# Patient Record
Sex: Male | Born: 1980 | Race: White | Hispanic: No | Marital: Married | Smoking: Current some day smoker
Health system: Southern US, Community
[De-identification: ages and names within clinical notes are randomized; demographics above are authoritative.]

## PROBLEM LIST (undated history)

## (undated) DIAGNOSIS — F1010 Alcohol abuse, uncomplicated: Secondary | ICD-10-CM

## (undated) DIAGNOSIS — K859 Acute pancreatitis without necrosis or infection, unspecified: Secondary | ICD-10-CM

## (undated) HISTORY — PX: WISDOM TOOTH EXTRACTION: SHX21

## (undated) HISTORY — PX: OTHER SURGICAL HISTORY: SHX169

---

## 2014-07-31 ENCOUNTER — Emergency Department (HOSPITAL_BASED_OUTPATIENT_CLINIC_OR_DEPARTMENT_OTHER): Payer: BLUE CROSS/BLUE SHIELD

## 2014-07-31 ENCOUNTER — Encounter (HOSPITAL_BASED_OUTPATIENT_CLINIC_OR_DEPARTMENT_OTHER): Payer: Self-pay | Admitting: *Deleted

## 2014-07-31 ENCOUNTER — Inpatient Hospital Stay (HOSPITAL_BASED_OUTPATIENT_CLINIC_OR_DEPARTMENT_OTHER)
Admission: EM | Admit: 2014-07-31 | Discharge: 2014-08-06 | DRG: 440 | Disposition: A | Discharge status: Home / Self Care | Payer: BLUE CROSS/BLUE SHIELD | Attending: Surgery | Admitting: Surgery

## 2014-07-31 DIAGNOSIS — S3991XA Unspecified injury of abdomen, initial encounter: Secondary | ICD-10-CM | POA: Diagnosis present

## 2014-07-31 DIAGNOSIS — Z885 Allergy status to narcotic agent status: Secondary | ICD-10-CM

## 2014-07-31 DIAGNOSIS — K852 Alcohol induced acute pancreatitis: Principal | ICD-10-CM | POA: Diagnosis present

## 2014-07-31 DIAGNOSIS — K859 Acute pancreatitis without necrosis or infection, unspecified: Secondary | ICD-10-CM

## 2014-07-31 DIAGNOSIS — T1490XA Injury, unspecified, initial encounter: Secondary | ICD-10-CM

## 2014-07-31 DIAGNOSIS — T1490 Injury, unspecified: Secondary | ICD-10-CM | POA: Diagnosis not present

## 2014-07-31 DIAGNOSIS — W19XXXA Unspecified fall, initial encounter: Secondary | ICD-10-CM | POA: Diagnosis present

## 2014-07-31 DIAGNOSIS — W109XXA Fall (on) (from) unspecified stairs and steps, initial encounter: Secondary | ICD-10-CM | POA: Diagnosis present

## 2014-07-31 DIAGNOSIS — M549 Dorsalgia, unspecified: Secondary | ICD-10-CM | POA: Diagnosis present

## 2014-07-31 DIAGNOSIS — F1010 Alcohol abuse, uncomplicated: Secondary | ICD-10-CM | POA: Diagnosis present

## 2014-07-31 DIAGNOSIS — S39012A Strain of muscle, fascia and tendon of lower back, initial encounter: Secondary | ICD-10-CM

## 2014-07-31 DIAGNOSIS — F17210 Nicotine dependence, cigarettes, uncomplicated: Secondary | ICD-10-CM | POA: Diagnosis present

## 2014-07-31 DIAGNOSIS — R102 Pelvic and perineal pain: Secondary | ICD-10-CM | POA: Diagnosis present

## 2014-07-31 LAB — COMPREHENSIVE METABOLIC PANEL
ALBUMIN: 4.4 g/dL (ref 3.5–5.2)
ALT: 19 U/L (ref 0–53)
ANION GAP: 6 (ref 5–15)
AST: 35 U/L (ref 0–37)
Alkaline Phosphatase: 119 U/L — ABNORMAL HIGH (ref 39–117)
BUN: 10 mg/dL (ref 6–23)
CALCIUM: 8.6 mg/dL (ref 8.4–10.5)
CO2: 25 mmol/L (ref 19–32)
CREATININE: 0.81 mg/dL (ref 0.50–1.35)
Chloride: 104 mmol/L (ref 96–112)
GFR calc Af Amer: >90 mL/min (ref >90)
Glucose, Bld: 112 mg/dL — ABNORMAL HIGH (ref 70–99)
Potassium: 3.6 mmol/L (ref 3.5–5.1)
Sodium: 135 mmol/L (ref 135–145)
Total Bilirubin: 1.4 mg/dL — ABNORMAL HIGH (ref 0.3–1.2)
Total Protein: 7.3 g/dL (ref 6.0–8.3)

## 2014-07-31 LAB — CBC
HCT: 41.9 % (ref 39.0–52.0)
HEMOGLOBIN: 14.8 g/dL (ref 13.0–17.0)
MCH: 31 pg (ref 26.0–34.0)
MCHC: 35.3 g/dL (ref 30.0–36.0)
MCV: 87.8 fL (ref 78.0–100.0)
Platelets: 254 10*3/uL (ref 150–400)
RBC: 4.77 MIL/uL (ref 4.22–5.81)
RDW: 12.7 % (ref 11.5–15.5)
WBC: 12 10*3/uL — AB (ref 4.0–10.5)

## 2014-07-31 LAB — URINALYSIS, ROUTINE W REFLEX MICROSCOPIC
Bilirubin Urine: NEGATIVE
Glucose, UA: NEGATIVE mg/dL
Hgb urine dipstick: NEGATIVE
Ketones, ur: NEGATIVE mg/dL
LEUKOCYTES UA: NEGATIVE
Nitrite: NEGATIVE
Protein, ur: NEGATIVE mg/dL
UROBILINOGEN UA: 0.2 mg/dL (ref 0.0–1.0)
pH: 6 (ref 5.0–8.0)

## 2014-07-31 LAB — PROTIME-INR
INR: 1 (ref 0.00–1.49)
Prothrombin Time: 13.2 seconds (ref 11.6–15.2)

## 2014-07-31 LAB — LIPASE, BLOOD: Lipase: 119 U/L — ABNORMAL HIGH (ref 11–59)

## 2014-07-31 LAB — ETHANOL: Alcohol, Ethyl (B): <5 mg/dL (ref 0–9)

## 2014-07-31 MED ORDER — MORPHINE SULFATE 4 MG/ML IJ SOLN
4.0000 mg | INTRAMUSCULAR | Status: DC | PRN
  Administered 2014-07-31: 4 mg via INTRAVENOUS
  Filled 2014-07-31: qty 1

## 2014-07-31 MED ORDER — KETOROLAC TROMETHAMINE 30 MG/ML IJ SOLN
INTRAMUSCULAR | Status: AC
  Administered 2014-07-31: 30 mg
  Filled 2014-07-31: qty 1

## 2014-07-31 MED ORDER — HYDROMORPHONE HCL 1 MG/ML IJ SOLN: 0.5000 mg | INTRAMUSCULAR | Status: DC | PRN

## 2014-07-31 MED ORDER — ONDANSETRON HCL 4 MG/2ML IJ SOLN
4.0000 mg | Freq: Four times a day (QID) | INTRAMUSCULAR | Status: DC | PRN
  Administered 2014-08-01: 4 mg via INTRAVENOUS
  Filled 2014-07-31: qty 2

## 2014-07-31 MED ORDER — ONDANSETRON HCL 4 MG/2ML IJ SOLN
4.0000 mg | Freq: Once | INTRAMUSCULAR | Status: AC
  Administered 2014-07-31: 4 mg via INTRAVENOUS
  Filled 2014-07-31: qty 2

## 2014-07-31 MED ORDER — ENOXAPARIN SODIUM 40 MG/0.4ML ~~LOC~~ SOLN
40.0000 mg | SUBCUTANEOUS | Status: DC
  Administered 2014-08-01 – 2014-08-06 (×6): 40 mg via SUBCUTANEOUS
  Filled 2014-07-31 (×8): qty 0.4

## 2014-07-31 MED ORDER — MORPHINE SULFATE 4 MG/ML IJ SOLN
4.0000 mg | Freq: Once | INTRAMUSCULAR | Status: AC
  Administered 2014-07-31: 4 mg via INTRAVENOUS
  Filled 2014-07-31: qty 1

## 2014-07-31 MED ORDER — KETOROLAC TROMETHAMINE 30 MG/ML IJ SOLN
30.0000 mg | Freq: Once | INTRAMUSCULAR | Status: AC
  Administered 2014-07-31: 30 mg via INTRAVENOUS

## 2014-07-31 MED ORDER — PANTOPRAZOLE SODIUM 40 MG PO TBEC
40.0000 mg | DELAYED_RELEASE_TABLET | Freq: Every day | ORAL | Status: DC
  Administered 2014-07-31 – 2014-08-06 (×7): 40 mg via ORAL
  Filled 2014-07-31 (×8): qty 1

## 2014-07-31 MED ORDER — CHLORHEXIDINE GLUCONATE 0.12 % MT SOLN
15.0000 mL | Freq: Two times a day (BID) | OROMUCOSAL | Status: DC
  Administered 2014-08-01 – 2014-08-03 (×3): 15 mL via OROMUCOSAL
  Filled 2014-07-31 (×3): qty 15

## 2014-07-31 MED ORDER — ONDANSETRON HCL 4 MG PO TABS: 4.0000 mg | ORAL_TABLET | Freq: Four times a day (QID) | ORAL | Status: DC | PRN

## 2014-07-31 MED ORDER — PANTOPRAZOLE SODIUM 40 MG IV SOLR
40.0000 mg | Freq: Every day | INTRAVENOUS | Status: DC
  Filled 2014-07-31: qty 40

## 2014-07-31 MED ORDER — POTASSIUM CHLORIDE IN NACL 20-0.9 MEQ/L-% IV SOLN
INTRAVENOUS | Status: DC
  Administered 2014-07-31 – 2014-08-05 (×13): via INTRAVENOUS
  Filled 2014-07-31 (×16): qty 1000

## 2014-07-31 MED ORDER — CETYLPYRIDINIUM CHLORIDE 0.05 % MT LIQD
7.0000 mL | Freq: Two times a day (BID) | OROMUCOSAL | Status: DC
  Administered 2014-07-31 – 2014-08-03 (×2): 7 mL via OROMUCOSAL

## 2014-07-31 MED ORDER — MORPHINE SULFATE 4 MG/ML IJ SOLN
4.0000 mg | Freq: Once | INTRAMUSCULAR | Status: AC
Start: 2014-07-31 — End: 2014-07-31
  Administered 2014-07-31: 4 mg via INTRAVENOUS
  Filled 2014-07-31: qty 1

## 2014-07-31 MED ORDER — IOHEXOL 300 MG/ML  SOLN
100.0000 mL | Freq: Once | INTRAMUSCULAR | Status: AC | PRN
  Administered 2014-07-31: 100 mL via INTRAVENOUS

## 2014-07-31 MED ORDER — SODIUM CHLORIDE 0.9 % IV BOLUS (SEPSIS)
1000.0000 mL | Freq: Once | INTRAVENOUS | Status: AC
  Administered 2014-07-31: 1000 mL via INTRAVENOUS

## 2014-07-31 MED ORDER — HYDROMORPHONE HCL 1 MG/ML IJ SOLN
1.0000 mg | Freq: Once | INTRAMUSCULAR | Status: AC
Start: 2014-07-31 — End: 2014-08-01
  Administered 2014-07-31: 1 mg via INTRAVENOUS
  Filled 2014-07-31: qty 1

## 2014-07-31 MED ORDER — HYDROMORPHONE HCL 1 MG/ML IJ SOLN
1.0000 mg | INTRAMUSCULAR | Status: DC | PRN
  Administered 2014-08-01: 1 mg via INTRAVENOUS
  Filled 2014-07-31 (×3): qty 1

## 2014-07-31 MED ORDER — HYDROMORPHONE HCL 1 MG/ML IJ SOLN
1.0000 mg | INTRAMUSCULAR | Status: DC | PRN
  Administered 2014-07-31 – 2014-08-01 (×13): 1 mg via INTRAVENOUS
  Filled 2014-07-31 (×11): qty 1

## 2014-07-31 NOTE — ED Notes (Addendum)
Patient c/o lower back and abd pain after slipping and falling down a flight of stairs yesterday. Woke up around 3am with abd pain andVomited this morning, abd remains tender no LOC

## 2014-07-31 NOTE — ED Provider Notes (Signed)
CSN: [id number]     Arrival date & time 07/31/14  5284 History   First MD Initiated Contact with Patient 07/31/14 1005     Chief Complaint  Patient presents with  . Fall      Patient is a 34 y.o. male presenting with fall. The history is provided by the patient.  Fall This is a new problem. The current episode started 12 to 24 hours ago. The problem has been rapidly worsening. Associated symptoms include abdominal pain. Pertinent negatives include no chest pain, no headaches and no shortness of breath. The symptoms are aggravated by walking. The symptoms are relieved by rest.  Patient reports he slipped/fell down approximately 20 steps yesterday He reports there was water at top of steps which caused him to slip He reports falling on back.  He did not fall head over heels No head injury.  No LOC No HA No neck pain He reports he felt sore after but no immediate pain He woke up this morning at 3:30am, felt abdominal pain, drank coca cola and immediately had nonbloody emesis Since then he has had persistent abdominal and back pain He denies arm/leg weakness/numbness No cp/sob  PMH - none Soc hx - drinks ETOH most days of the week "1 or 2 beers" Past Surgical History  Procedure Laterality Date  .  thumb surgery Right   . Wisdom tooth extraction Bilateral    No family history on file. History  Substance Use Topics  . Smoking status: Current Some Day Smoker  . Smokeless tobacco: Not on file  . Alcohol Use: Yes    Review of Systems  Constitutional: Negative for fever.  Respiratory: Negative for shortness of breath.   Cardiovascular: Negative for chest pain.  Gastrointestinal: Positive for vomiting, abdominal pain and constipation.  Genitourinary: Negative for difficulty urinating.  Musculoskeletal: Positive for back pain. Negative for neck pain.  Neurological: Negative for weakness and headaches.  All other systems reviewed and are negative.     Allergies   Codeine  Home Medications   Prior to Admission medications   Not on File   BP 147/83 mmHg  Pulse 74  Temp(Src) 97.9 F (36.6 C) (Oral)  Resp 20  Ht 5' 8"  (1.727 m)  Wt 140 lb (63.504 kg)  BMI 21.29 kg/m2  SpO2 100% Physical Exam CONSTITUTIONAL: Well developed/well nourished HEAD: Normocephalic/atraumatic EYES: EOMI/PERRL ENMT: Mucous membranes moist, No evidence of facial/nasal trauma NECK: supple no meningeal signs SPINE/BACK:no cervical spine tenderness.  NEXUS criteria met.  He has abrasion/tenderness to lumbar spine and mild tenderness to low thoracic spine.  No bruising/crepitance/stepoffs noted to spine CV: S1/S2 noted, no murmurs/rubs/gallops noted LUNGS: Lungs are clear to auscultation bilaterally, no apparent distress Chest -  Mild chest wall tenderness to low chest but no bruising noted ABDOMEN: soft, diffuse moderate tenderness with severe tenderness to LUQ.   GU:no cva tenderness NEURO: Pt is awake/alert/appropriate, moves all extremitiesx4.  No facial droop.  GCS 15 EXTREMITIES: pulses normal/equal, full ROM, All other extremities/joints palpated/ranged and nontender SKIN: warm, color normal PSYCH: no abnormalities of mood noted, alert and oriented to situation  ED Course  Procedures  11:09 AM Pt with significant low back/abdominal tenderness, imaging ordered He has no signs of head injury and no signs of neck injury Will follow closely Currently his vitals are appropriate 12:14 PM Discussed CT finding with patient Will consult trauma D/w dr Marden Noble blackman he will review CT imaging 12:28 PM D/w dr Ninfa Linden He accepts in transfer to  [facility] after reviewing CT imaging D/w ER physician dr Doy Mince aware of patient  Labs Review Labs Reviewed  COMPREHENSIVE METABOLIC PANEL - Abnormal; Notable for the following:    Glucose, Bld 112 (*)    Alkaline Phosphatase 119 (*)    Total Bilirubin 1.4 (*)    All other components within normal limits  CBC - Abnormal;  Notable for the following:    WBC 12.0 (*)    All other components within normal limits  URINALYSIS, ROUTINE W REFLEX MICROSCOPIC - Abnormal; Notable for the following:    Specific Gravity, Urine >1.046 (*)    All other components within normal limits  LIPASE, BLOOD - Abnormal; Notable for the following:    Lipase 119 (*)    All other components within normal limits  ETHANOL  PROTIME-INR    Imaging Review Dg Chest 1 View  07/31/2014   CLINICAL DATA:  Fall down stairs yesterday.  Multiple trauma.  EXAM: CHEST  1 VIEW  COMPARISON:  01/10/2012  FINDINGS: The heart size and mediastinal contours are within normal limits. Both lungs are clear. No evidence of pneumothorax or hemothorax. The visualized skeletal structures are unremarkable.  IMPRESSION: No active disease.   Electronically Signed   By: Earle Gell M.D.   On: 07/31/2014 11:13   Ct Abdomen Pelvis W Contrast  07/31/2014   CLINICAL DATA:  34 year old male with acute fall and left abdominal and pelvic pain. Initial encounter.  EXAM: CT ABDOMEN AND PELVIS WITH CONTRAST  TECHNIQUE: Multidetector CT imaging of the abdomen and pelvis was performed using the standard protocol following bolus administration of intravenous contrast.  CONTRAST:  146m OMNIPAQUE IOHEXOL 300 MG/ML  SOLN  COMPARISON:  None.  FINDINGS: The lung bases are unremarkable.  A tiny cyst in the inferior right liver is identified. The liver is otherwise unremarkable.  The spleen, gallbladder, kidneys, adrenal glands and bladder are unremarkable.  A small amount of fluid/inflammation adjacent to the pancreatic tail and a mildly distended loop of small bowel (images 30-36) is nonspecific and could represent occult small bowel or venous injury. Occult pancreatic injury and mild pancreatitis is considered somewhat less likely as there appears be a fat plane between the pancreas and the fluid/inflammation. The pancreas appears unremarkable. There is no evidence of small bowel wall  thickening, pneumoperitoneum or bowel obstruction.  No enlarged lymph nodes, biliary dilatation or abdominal aortic aneurysm identified.  No acute or suspicious bony abnormalities are noted.  IMPRESSION: Small amount of fluid/ inflammation adjacent to the pancreatic tail and a mildly distended small bowel loop. This could represent occult small bowel injury or small venous injury. Occult pancreatic injury and mild pancreatitis is considered slightly less likely as there appears to be a fat plane between the pancreas and fluid/inflammation. No evidence of pneumoperitoneum or bowel wall thickening.  No other significant abnormalities identified.   Electronically Signed   By: JMargarette CanadaM.D.   On: 07/31/2014 11:18   Dg Thoracic Spine 1vclearing  07/31/2014   CLINICAL DATA:  Fall down stairs. Thoracic back pain. Multiple trauma.  EXAM: CLEARING THORACIC SPINE-ONE VIEW  COMPARISON:  None.  FINDINGS: Clearing cross-table lateral radiograph shows no definite evidence of thoracic spine fracture or subluxation. Note that this is not a complete radiographic evaluation.  IMPRESSION: Limited clearing view of the thoracic spine shows no definite acute abnormality.   Electronically Signed   By: JEarle GellM.D.   On: 07/31/2014 11:18    Medications  sodium chloride 0.9 %  bolus 1,000 mL (not administered)  HYDROmorphone (DILAUDID) injection 1 mg (not administered)  morphine 4 MG/ML injection 4 mg (4 mg Intravenous Given 07/31/14 1035)  ondansetron (ZOFRAN) injection 4 mg (4 mg Intravenous Given 07/31/14 1035)  iohexol (OMNIPAQUE) 300 MG/ML solution 100 mL (100 mLs Intravenous Contrast Given 07/31/14 1056)  morphine 4 MG/ML injection 4 mg (4 mg Intravenous Given 07/31/14 1136)    MDM   Final diagnoses:  Blunt abdominal trauma, initial encounter  Acute pancreatitis, unspecified pancreatitis type  Lumbar strain, initial encounter    Nursing notes including past medical history and social history reviewed and  considered in documentation Labs/vital reviewed myself and considered during evaluation xrays/imaging reviewed by myself and considered during evaluation     Sharyon Cable, MD 07/31/14 1228

## 2014-07-31 NOTE — ED Notes (Signed)
EMS called [facility]

## 2014-07-31 NOTE — ED Notes (Signed)
Patient came from [facility] on arrival. Patient fell yesterday and abnormal CT Pt transfer [facility].

## 2014-07-31 NOTE — H&P (Signed)
History   Darren Parrish is an 34 y.o. male.   Chief Complaint:  Chief Complaint  Patient presents with  . Fall    Fall   this gentleman presents as a transfer from [facility]. He presented there after a fall that occurred yesterday. He reports he slipped at the top of the stairs and fell down approximately 25 stairs on his back. He hit only his back. He denies headache, loss of consciousness, or neck pain. He did not hit his abdomen. He awoke this morning with moderate to severe left-sided abdominal pain. He had one episode of emesis.  He no longer has nausea. He still has moderate left-sided mostly upper abdominal pain which is described as both sharp and cramping. He does feel like it has improved somewhat. He admits to drinking approximately 4-5 beers yesterday. He has no other complaints.  History reviewed. No pertinent past medical history.  Past Surgical History  Procedure Laterality Date  .  thumb surgery Right   . Wisdom tooth extraction Bilateral     No family history on file. Social History:  reports that he has been smoking.  He does not have any smokeless tobacco history on file. He reports that he drinks alcohol. His drug history is not on file.  Allergies   Allergies  Allergen Reactions  . Codeine Nausea Only    Home Medications   (Not in a hospital admission)  Trauma Course   Results for orders placed or performed during the hospital encounter of 07/31/14 (from the past 48 hour(s))  Comprehensive metabolic panel     Status: Abnormal   Collection Time: 07/31/14 10:30 AM  Result Value Ref Range   Sodium 135 135 - 145 mmol/L   Potassium 3.6 3.5 - 5.1 mmol/L   Chloride 104 96 - 112 mmol/L   CO2 25 19 - 32 mmol/L   Glucose, Bld 112 (H) 70 - 99 mg/dL   BUN 10 6 - 23 mg/dL   Creatinine, Ser 0.81 0.50 - 1.35 mg/dL   Calcium 8.6 8.4 - 10.5 mg/dL   Total Protein 7.3 6.0 - 8.3 g/dL   Albumin 4.4 3.5 - 5.2 g/dL   AST 35 0 - 37 U/L   ALT 19 0 - 53 U/L    Alkaline Phosphatase 119 (H) 39 - 117 U/L   Total Bilirubin 1.4 (H) 0.3 - 1.2 mg/dL   GFR calc non Af Amer >90 >90 mL/min   GFR calc Af Amer >90 >90 mL/min    Comment: (NOTE) The eGFR has been calculated using the CKD EPI equation. This calculation has not been validated in all clinical situations. eGFR's persistently <90 mL/min signify possible Chronic Kidney Disease.    Anion gap 6 5 - 15  CBC     Status: Abnormal   Collection Time: 07/31/14 10:30 AM  Result Value Ref Range   WBC 12.0 (H) 4.0 - 10.5 K/uL   RBC 4.77 4.22 - 5.81 MIL/uL   Hemoglobin 14.8 13.0 - 17.0 g/dL   HCT 41.9 39.0 - 52.0 %   MCV 87.8 78.0 - 100.0 fL   MCH 31.0 26.0 - 34.0 pg   MCHC 35.3 30.0 - 36.0 g/dL   RDW 12.7 11.5 - 15.5 %   Platelets 254 150 - 400 K/uL  Ethanol     Status: None   Collection Time: 07/31/14 10:30 AM  Result Value Ref Range   Alcohol, Ethyl (B) <5 0 - 9 mg/dL    Comment:  LOWEST DETECTABLE LIMIT FOR SERUM ALCOHOL IS 11 mg/dL FOR MEDICAL PURPOSES ONLY   Protime-INR     Status: None   Collection Time: 07/31/14 10:30 AM  Result Value Ref Range   Prothrombin Time 13.2 11.6 - 15.2 seconds   INR 1.00 0.00 - 1.49  Lipase, blood     Status: Abnormal   Collection Time: 07/31/14 10:30 AM  Result Value Ref Range   Lipase 119 (H) 11 - 59 U/L  Urinalysis, Routine w reflex microscopic     Status: Abnormal   Collection Time: 07/31/14 11:31 AM  Result Value Ref Range   Color, Urine YELLOW YELLOW   APPearance CLEAR CLEAR   Specific Gravity, Urine >1.046 (H) 1.005 - 1.030   pH 6.0 5.0 - 8.0   Glucose, UA NEGATIVE NEGATIVE mg/dL   Hgb urine dipstick NEGATIVE NEGATIVE   Bilirubin Urine NEGATIVE NEGATIVE   Ketones, ur NEGATIVE NEGATIVE mg/dL   Protein, ur NEGATIVE NEGATIVE mg/dL   Urobilinogen, UA 0.2 0.0 - 1.0 mg/dL   Nitrite NEGATIVE NEGATIVE   Leukocytes, UA NEGATIVE NEGATIVE    Comment: MICROSCOPIC NOT DONE ON URINES WITH NEGATIVE PROTEIN, BLOOD, LEUKOCYTES, NITRITE, OR GLUCOSE  <1000 mg/dL.   Dg Chest 1 View  07/31/2014   CLINICAL DATA:  Fall down stairs yesterday.  Multiple trauma.  EXAM: CHEST  1 VIEW  COMPARISON:  01/10/2012  FINDINGS: The heart size and mediastinal contours are within normal limits. Both lungs are clear. No evidence of pneumothorax or hemothorax. The visualized skeletal structures are unremarkable.  IMPRESSION: No active disease.   Electronically Signed   By: Earle Gell M.D.   On: 07/31/2014 11:13   Ct Abdomen Pelvis W Contrast  07/31/2014   CLINICAL DATA:  34 year old male with acute fall and left abdominal and pelvic pain. Initial encounter.  EXAM: CT ABDOMEN AND PELVIS WITH CONTRAST  TECHNIQUE: Multidetector CT imaging of the abdomen and pelvis was performed using the standard protocol following bolus administration of intravenous contrast.  CONTRAST:  128m OMNIPAQUE IOHEXOL 300 MG/ML  SOLN  COMPARISON:  None.  FINDINGS: The lung bases are unremarkable.  A tiny cyst in the inferior right liver is identified. The liver is otherwise unremarkable.  The spleen, gallbladder, kidneys, adrenal glands and bladder are unremarkable.  A small amount of fluid/inflammation adjacent to the pancreatic tail and a mildly distended loop of small bowel (images 30-36) is nonspecific and could represent occult small bowel or venous injury. Occult pancreatic injury and mild pancreatitis is considered somewhat less likely as there appears be a fat plane between the pancreas and the fluid/inflammation. The pancreas appears unremarkable. There is no evidence of small bowel wall thickening, pneumoperitoneum or bowel obstruction.  No enlarged lymph nodes, biliary dilatation or abdominal aortic aneurysm identified.  No acute or suspicious bony abnormalities are noted.  IMPRESSION: Small amount of fluid/ inflammation adjacent to the pancreatic tail and a mildly distended small bowel loop. This could represent occult small bowel injury or small venous injury. Occult pancreatic injury and  mild pancreatitis is considered slightly less likely as there appears to be a fat plane between the pancreas and fluid/inflammation. No evidence of pneumoperitoneum or bowel wall thickening.  No other significant abnormalities identified.   Electronically Signed   By: JMargarette CanadaM.D.   On: 07/31/2014 11:18   Dg Thoracic Spine 1vclearing  07/31/2014   CLINICAL DATA:  Fall down stairs. Thoracic back pain. Multiple trauma.  EXAM: CLEARING THORACIC SPINE-ONE VIEW  COMPARISON:  None.  FINDINGS: Clearing cross-table lateral radiograph shows no definite evidence of thoracic spine fracture or subluxation. Note that this is not a complete radiographic evaluation.  IMPRESSION: Limited clearing view of the thoracic spine shows no definite acute abnormality.   Electronically Signed   By: Earle Gell M.D.   On: 07/31/2014 11:18    Review of Systems  All other systems reviewed and are negative.   Blood pressure 128/75, pulse 67, temperature 97.9 F (36.6 C), temperature source Oral, resp. rate 16, height 5' 8"  (1.727 m), weight 140 lb (63.504 kg), SpO2 100 %. Physical Exam  Constitutional: He is oriented to person, place, and time. He appears well-developed and well-nourished. No distress.  HENT:  Head: Normocephalic and atraumatic.  Right Ear: External ear normal.  Left Ear: External ear normal.  Nose: Nose normal.  Mouth/Throat: Oropharynx is clear and moist. No oropharyngeal exudate.  Eyes: Conjunctivae are normal. Pupils are equal, round, and reactive to light. No scleral icterus.  Neck: Normal range of motion. No tracheal deviation present.  Cervical spine is nontender  Cardiovascular: Normal rate, regular rhythm, normal heart sounds and intact distal pulses.   No murmur heard. Respiratory: Effort normal and breath sounds normal. No respiratory distress. He exhibits no tenderness.  GI: Soft. There is tenderness. There is guarding.  There is mild to moderate tenderness in the epigastrium and left  upper quadrant. There is no frank peritonitis  Musculoskeletal: Normal range of motion. He exhibits no edema or tenderness.  Neurological: He is alert and oriented to person, place, and time.  Skin: Skin is warm and dry. No rash noted. No erythema.  Psychiatric: His behavior is normal. Judgment normal.   there is an abrasion with tenderness at the midline of his lower back  Assessment/Plan Patient status post fall with blunt trauma to the abdomen.  The CT findings are suspicious that he may have had an injury to the proximal small bowel or the mesentery near the tail the pancreas. His lipase is elevated and I cannot totally rule out that this is pancreatitis. The CAT scan did not show any free air. His exam is currently not consistent with perforated bowel. I believe he needs placement in observation with bowel rest and serial abdominal exams. If his condition worsens, he may either need a repeat CAT scan versus a laparotomy for a possible bowel injury. I explained this to the patient and his wife. He is in agreement with the admission.  Sharonda Llamas A 07/31/2014, 2:18 PM   Procedures

## 2014-07-31 NOTE — ED Notes (Signed)
Attemped report 

## 2014-08-01 ENCOUNTER — Observation Stay (HOSPITAL_COMMUNITY): Payer: BLUE CROSS/BLUE SHIELD

## 2014-08-01 DIAGNOSIS — K852 Alcohol induced acute pancreatitis: Secondary | ICD-10-CM | POA: Diagnosis present

## 2014-08-01 DIAGNOSIS — F1010 Alcohol abuse, uncomplicated: Secondary | ICD-10-CM | POA: Diagnosis present

## 2014-08-01 DIAGNOSIS — M549 Dorsalgia, unspecified: Secondary | ICD-10-CM | POA: Diagnosis present

## 2014-08-01 DIAGNOSIS — R102 Pelvic and perineal pain: Secondary | ICD-10-CM | POA: Diagnosis present

## 2014-08-01 DIAGNOSIS — S3991XA Unspecified injury of abdomen, initial encounter: Secondary | ICD-10-CM | POA: Diagnosis present

## 2014-08-01 DIAGNOSIS — W109XXA Fall (on) (from) unspecified stairs and steps, initial encounter: Secondary | ICD-10-CM | POA: Diagnosis present

## 2014-08-01 DIAGNOSIS — T1490 Injury, unspecified: Secondary | ICD-10-CM | POA: Diagnosis present

## 2014-08-01 DIAGNOSIS — K859 Acute pancreatitis without necrosis or infection, unspecified: Secondary | ICD-10-CM | POA: Diagnosis present

## 2014-08-01 DIAGNOSIS — Z885 Allergy status to narcotic agent status: Secondary | ICD-10-CM | POA: Diagnosis not present

## 2014-08-01 DIAGNOSIS — F17210 Nicotine dependence, cigarettes, uncomplicated: Secondary | ICD-10-CM | POA: Diagnosis present

## 2014-08-01 LAB — COMPREHENSIVE METABOLIC PANEL
ALT: 21 U/L (ref 0–53)
ANION GAP: 11 (ref 5–15)
AST: 47 U/L — AB (ref 0–37)
Albumin: 3.8 g/dL (ref 3.5–5.2)
Alkaline Phosphatase: 120 U/L — ABNORMAL HIGH (ref 39–117)
BUN: 7 mg/dL (ref 6–23)
CO2: 26 mmol/L (ref 19–32)
Calcium: 8.6 mg/dL (ref 8.4–10.5)
Chloride: 104 mmol/L (ref 96–112)
Creatinine, Ser: 0.87 mg/dL (ref 0.50–1.35)
GFR calc Af Amer: >90 mL/min (ref >90)
Glucose, Bld: 88 mg/dL (ref 70–99)
Potassium: 3.6 mmol/L (ref 3.5–5.1)
SODIUM: 141 mmol/L (ref 135–145)
Total Bilirubin: 3.7 mg/dL — ABNORMAL HIGH (ref 0.3–1.2)
Total Protein: 6.8 g/dL (ref 6.0–8.3)

## 2014-08-01 LAB — CBC
HEMATOCRIT: 40.7 % (ref 39.0–52.0)
Hemoglobin: 14.2 g/dL (ref 13.0–17.0)
MCH: 31 pg (ref 26.0–34.0)
MCHC: 34.9 g/dL (ref 30.0–36.0)
MCV: 88.9 fL (ref 78.0–100.0)
PLATELETS: 194 10*3/uL (ref 150–400)
RBC: 4.58 MIL/uL (ref 4.22–5.81)
RDW: 13 % (ref 11.5–15.5)
WBC: 7.5 10*3/uL (ref 4.0–10.5)

## 2014-08-01 LAB — LIPASE, BLOOD: Lipase: 934 U/L — ABNORMAL HIGH (ref 11–59)

## 2014-08-01 LAB — BILIRUBIN, FRACTIONATED(TOT/DIR/INDIR)
BILIRUBIN DIRECT: 0.4 mg/dL (ref 0.0–0.5)
Indirect Bilirubin: 3.3 mg/dL — ABNORMAL HIGH (ref 0.3–0.9)
Total Bilirubin: 3.7 mg/dL — ABNORMAL HIGH (ref 0.3–1.2)

## 2014-08-01 LAB — AMYLASE: Amylase: 539 U/L — ABNORMAL HIGH (ref 0–105)

## 2014-08-01 MED ORDER — HYDROMORPHONE HCL 1 MG/ML IJ SOLN
INTRAMUSCULAR | Status: AC
  Filled 2014-08-01: qty 1

## 2014-08-01 MED ORDER — DIPHENHYDRAMINE HCL 12.5 MG/5ML PO ELIX: 12.5000 mg | ORAL_SOLUTION | Freq: Four times a day (QID) | ORAL | Status: DC | PRN

## 2014-08-01 MED ORDER — DIPHENHYDRAMINE HCL 50 MG/ML IJ SOLN: 12.5000 mg | Freq: Four times a day (QID) | INTRAMUSCULAR | Status: DC | PRN

## 2014-08-01 MED ORDER — HYDROMORPHONE 0.3 MG/ML IV SOLN
INTRAVENOUS | Status: DC
  Administered 2014-08-01 (×2): via INTRAVENOUS
  Administered 2014-08-01: 12.47 mg via INTRAVENOUS
  Administered 2014-08-02: 6.01 mg via INTRAVENOUS
  Administered 2014-08-02: 8.68 mg via INTRAVENOUS
  Administered 2014-08-02: 11:00:00 via INTRAVENOUS
  Filled 2014-08-01 (×5): qty 25

## 2014-08-01 MED ORDER — SODIUM CHLORIDE 0.9 % IJ SOLN: 9.0000 mL | INTRAMUSCULAR | Status: DC | PRN

## 2014-08-01 MED ORDER — HYDROMORPHONE 0.3 MG/ML IV SOLN
INTRAVENOUS | Status: DC
  Administered 2014-08-01: 12:00:00 via INTRAVENOUS
  Filled 2014-08-01: qty 25

## 2014-08-01 MED ORDER — HYDROMORPHONE HCL 1 MG/ML IJ SOLN
1.0000 mg | Freq: Once | INTRAMUSCULAR | Status: AC
  Administered 2014-08-01: 1 mg via INTRAVENOUS

## 2014-08-01 MED ORDER — NALOXONE HCL 0.4 MG/ML IJ SOLN: 0.4000 mg | INTRAMUSCULAR | Status: DC | PRN

## 2014-08-01 NOTE — Progress Notes (Signed)
Patient ID: Darren Parrish, male   DOB: 02/12/1981, 34 y.o.   MRN: [id number]   Subjective: Doesn't feel any better. Significant LUQ/epigastric pain with radiation to left back. Denies N/V.   Objective: Vital signs in last 24 hours: Temp:  [97.9 F (36.6 C)-98.4 F (36.9 C)] 97.9 F (36.6 C) (02/15 0525) Pulse Rate:  [64-74] 66 (02/15 0525) Resp:  [16-20] 18 (02/15 0525) BP: (124-147)/(75-84) 142/78 mmHg (02/15 0525) SpO2:  [97 %-100 %] 98 % (02/15 0525) Weight:  [140 lb (63.504 kg)-145 lb 8 oz (65.998 kg)] 145 lb 8 oz (65.998 kg) (02/14 1611) Last BM Date: 07/28/14   Laboratory  CBC  Recent Labs  07/31/14 1030 08/01/14 0441  WBC 12.0* 7.5  HGB 14.8 14.2  HCT 41.9 40.7  PLT 254 194   BMET  Recent Labs  07/31/14 1030 08/01/14 0441  NA 135 141  K 3.6 3.6  CL 104 104  CO2 25 26  GLUCOSE 112* 88  BUN 10 7  CREATININE 0.81 0.87  CALCIUM 8.6 8.6   CMP     Component Value Date/Time   NA 141 08/01/2014 0441   K 3.6 08/01/2014 0441   CL 104 08/01/2014 0441   CO2 26 08/01/2014 0441   GLUCOSE 88 08/01/2014 0441   BUN 7 08/01/2014 0441   CREATININE 0.87 08/01/2014 0441   CALCIUM 8.6 08/01/2014 0441   PROT 6.8 08/01/2014 0441   ALBUMIN 3.8 08/01/2014 0441   AST 47* 08/01/2014 0441   ALT 21 08/01/2014 0441   ALKPHOS 120* 08/01/2014 0441   BILITOT 3.7* 08/01/2014 0441   GFRNONAA >90 08/01/2014 0441   GFRAA >90 08/01/2014 0441   Amylase: 539 Lipase: 934   Physical Exam General appearance: alert and no distress Resp: clear to auscultation bilaterally Cardio: regular rate and rhythm GI: Soft, +BS, - Murphy's, +LUQ TTP   Assessment/Plan: Fall Pancreatitis -- Unclear if this is traumatic, alcoholic, or gallstone. Elevated bili is suggestive of latter. Will fractionate and get RUQ US. EtOH use -- SW to assess FEN -- Continue NPO, IVF. Will give PCA. VTE -- SCD's, Lovenox Dispo -- As above    Freeman CaldronMichael J. Deunte Bledsoe, PA-C Pager: [phone] General Trauma PA  Pager: [phone]  08/01/2014

## 2014-08-01 NOTE — Progress Notes (Signed)
UR completed 

## 2014-08-02 DIAGNOSIS — F1010 Alcohol abuse, uncomplicated: Secondary | ICD-10-CM | POA: Diagnosis present

## 2014-08-02 LAB — COMPREHENSIVE METABOLIC PANEL
ALK PHOS: 101 U/L (ref 39–117)
ALT: 15 U/L (ref 0–53)
AST: 30 U/L (ref 0–37)
Albumin: 3.3 g/dL — ABNORMAL LOW (ref 3.5–5.2)
Anion gap: 12 (ref 5–15)
BILIRUBIN TOTAL: 2.7 mg/dL — AB (ref 0.3–1.2)
BUN: 8 mg/dL (ref 6–23)
CHLORIDE: 103 mmol/L (ref 96–112)
CO2: 21 mmol/L (ref 19–32)
CREATININE: 0.96 mg/dL (ref 0.50–1.35)
Calcium: 8.5 mg/dL (ref 8.4–10.5)
GFR calc Af Amer: >90 mL/min (ref >90)
Glucose, Bld: 61 mg/dL — ABNORMAL LOW (ref 70–99)
POTASSIUM: 4.4 mmol/L (ref 3.5–5.1)
SODIUM: 136 mmol/L (ref 135–145)
Total Protein: 6.1 g/dL (ref 6.0–8.3)

## 2014-08-02 LAB — AMYLASE: Amylase: 680 U/L — ABNORMAL HIGH (ref 0–105)

## 2014-08-02 LAB — LIPASE, BLOOD: LIPASE: 657 U/L — AB (ref 11–59)

## 2014-08-02 MED ORDER — HYDROMORPHONE HCL 1 MG/ML IJ SOLN
0.5000 mg | INTRAMUSCULAR | Status: DC | PRN
Start: 2014-08-02 — End: 2014-08-02
  Administered 2014-08-02: 2 mg via INTRAVENOUS
  Filled 2014-08-02: qty 2

## 2014-08-02 MED ORDER — POLYETHYLENE GLYCOL 3350 17 G PO PACK
17.0000 g | PACK | Freq: Every day | ORAL | Status: DC
  Administered 2014-08-02 – 2014-08-04 (×2): 17 g via ORAL
  Filled 2014-08-02 (×3): qty 1

## 2014-08-02 MED ORDER — DOCUSATE SODIUM 100 MG PO CAPS
100.0000 mg | ORAL_CAPSULE | Freq: Two times a day (BID) | ORAL | Status: DC
  Administered 2014-08-02 – 2014-08-06 (×7): 100 mg via ORAL
  Filled 2014-08-02 (×8): qty 1

## 2014-08-02 MED ORDER — HYDROMORPHONE HCL 1 MG/ML IJ SOLN
1.0000 mg | INTRAMUSCULAR | Status: DC | PRN
  Administered 2014-08-02 – 2014-08-04 (×13): 1 mg via INTRAVENOUS
  Filled 2014-08-02 (×14): qty 1

## 2014-08-02 NOTE — Progress Notes (Signed)
Patient ID: Darren Parrish, male   DOB: 07/29/1980, 34 y.o.   MRN: [id number]   LOS: 1 day   Subjective: Pain better controlled, hungry.   Objective: Vital signs in last 24 hours: Temp:  [98 F (36.7 C)-98.8 F (37.1 C)] 98 F (36.7 C) (02/16 0553) Pulse Rate:  [70-93] 83 (02/16 0553) Resp:  [13-19] 13 (02/16 0724) BP: (133-143)/(78-87) 133/78 mmHg (02/16 0553) SpO2:  [95 %-100 %] 96 % (02/16 0724) Last BM Date: 07/28/14   Laboratory  BMET  Recent Labs  08/01/14 0441 08/02/14 0338  NA 141 136  K 3.6 4.4  CL 104 103  CO2 26 21  GLUCOSE 88 61*  BUN 7 8  CREATININE 0.87 0.96  CALCIUM 8.6 8.5   Amylase    Component Value Date/Time   AMYLASE 680* 08/02/2014 0338   Lipase     Component Value Date/Time   LIPASE 657* 08/02/2014 0338    Physical Exam General appearance: alert and no distress Resp: clear to auscultation bilaterally Cardio: regular rate and rhythm GI: Firm, +BS, Mod LUQ TTP   Assessment/Plan: Fall Pancreatitis -- Appears likely alcoholic etiology although biliary is also possibility given GB sludge on US. Should probably get elective cholecystectomy after acute illness resolves. Check enzymes tomorrow. EtOH use -- SW to assess FEN -- Continue NPO, IVF, PCA VTE -- SCD's, Lovenox Dispo -- As above    Freeman CaldronMichael J. Nadyne Gariepy, PA-C Pager: [phone] General Trauma PA Pager: [phone]  08/02/2014

## 2014-08-02 NOTE — Progress Notes (Signed)
Pt c/o of pain at 2120,was earlier given iv dilaudid 1mg  at 2014 now requesting to change the dose to 2mg  every 4hrs as he was getting prior to shift change, Dr Corliss Skainssuei paged and notified, said to leave the present dose as prescribed, same reported back to pt,was however reassured, v/s stable, will continue to monitor. Obasogie-Asidi, Yamilka Lopiccolo Efe

## 2014-08-03 LAB — BASIC METABOLIC PANEL
Anion gap: 11 (ref 5–15)
BUN: 7 mg/dL (ref 6–23)
CALCIUM: 9.1 mg/dL (ref 8.4–10.5)
CO2: 24 mmol/L (ref 19–32)
CREATININE: 0.96 mg/dL (ref 0.50–1.35)
Chloride: 101 mmol/L (ref 96–112)
GFR calc non Af Amer: >90 mL/min (ref >90)
Glucose, Bld: 47 mg/dL — ABNORMAL LOW (ref 70–99)
Potassium: 4.3 mmol/L (ref 3.5–5.1)
Sodium: 136 mmol/L (ref 135–145)

## 2014-08-03 LAB — AMYLASE: Amylase: 362 U/L — ABNORMAL HIGH (ref 0–105)

## 2014-08-03 LAB — LIPASE, BLOOD: Lipase: 279 U/L — ABNORMAL HIGH (ref 11–59)

## 2014-08-03 MED ORDER — OXYCODONE HCL 5 MG PO TABS
10.0000 mg | ORAL_TABLET | ORAL | Status: DC | PRN
Start: 2014-08-03 — End: 2014-08-06
  Administered 2014-08-03 – 2014-08-06 (×18): 20 mg via ORAL
  Filled 2014-08-03 (×18): qty 4

## 2014-08-03 NOTE — Clinical Social Work Psychosocial (Signed)
Clinical Social Work Department BRIEF PSYCHOSOCIAL ASSESSMENT 08/03/2014  Patient:  Darren Parrish, Darren Parrish     Account Number:  [id number]     Admit date:  07/31/2014  Clinical Social Worker:  Lovey Newcomer  Date/Time:  08/02/2014 04:00 PM  Referred by:  Physician  Date Referred:  08/03/2014 Referred for  Substance Abuse   Other Referral:   NA   Interview type:  Patient Other interview type:   Patient alert and oriented at time of assessment.    PSYCHOSOCIAL DATA Living Status:  WIFE Admitted from facility:   Level of care:   Primary support name:  Ashleigh Primary support relationship to patient:  SPOUSE Degree of support available:   Support is good.    CURRENT CONCERNS Current Concerns  Substance Abuse   Other Concerns:   NA    SOCIAL WORK ASSESSMENT / PLAN CSW met with patient at bedside to complete SBIRT and assess patient's ETOH use. Patient resting comfortably in bed but does appear slightly diaphoretic. Patient appeared guarded when CSW explained reason for visit. Patient quickly stated to CSW, "I'm not interested in this because I don't have a drinking problem. My wife thinks I do, but I don't." CSW asked patient if he would be willing to complete a screener (SBIRT). During SBIRT patient became slightly more open to discussing his ETOH use. He reports that he drink "2-4 small beers a day." When asked for clarification on "small" the patient states that the beers he drinks are about 8-10oz a piece. Patient does report that "every once in a while" he drinks liqour.    CSW inquired about patient's motivation to seek treatment for his alcohol use, and the patient reports that he will not engage in treatment as he doesn't believe he has a drinking problem. CSW asked if any family members are concerned about his drinking and he reports that his wife worries about it but, "she's just overreacting." Patient reports a family history of alcoholism. Patient feels he does  not have a drinking problem because he is not "as bad" as his mom was. Despite these views, the patient does report occasionally feeling guilty for his alcohol use. Overall patient appears to be in pre-contemplative stage of change in regards to his use.    CSW inquired about patient's understanding of his daignosis. Per patient, "they" are unsure where his pancreatitis came from. He seems to believe this is related to the fall he experienced PTA and NOT related to alcohol use.   Assessment/plan status:  No Further Intervention Required Other assessment/ plan:   Complete SBIRT with patient.   Information/referral to community resources:   CSW has provided patient with substance abuse treatment facility list and AA meeting schedule.    PATIENT'S/FAMILY'S RESPONSE TO PLAN OF CARE: Patient does not appear to interested in seeking substance abuse treatment at discharge. He believes his pancreatitis has been caused by his fall. CSW signing off at this time as no further CSW intervention needed at this time.      Liz Beach MSW, [place], [place], [phone]

## 2014-08-03 NOTE — Progress Notes (Signed)
Patient ID: Darren Parrish, male   DOB: 11/28/1980, 34 y.o.   MRN: [id number]   LOS: 2 days   Subjective: Had some diarrhea that he attributes to stool softener/laxative. Says current pain regimen not working well at all, would like it changed.   Objective: Vital signs in last 24 hours: Temp:  [97.7 F (36.5 C)-99.7 F (37.6 C)] 97.7 F (36.5 C) (02/17 0500) Pulse Rate:  [72-88] 80 (02/17 0500) Resp:  [15-16] 16 (02/17 0500) BP: (127-139)/(68-80) 135/77 mmHg (02/17 0500) SpO2:  [96 %-100 %] 100 % (02/17 0500) Last BM Date: 07/28/14   Laboratory  BMET  Recent Labs  08/02/14 0338 08/03/14 0428  NA 136 136  K 4.4 4.3  CL 103 101  CO2 21 24  GLUCOSE 61* 47*  BUN 8 7  CREATININE 0.96 0.96  CALCIUM 8.5 9.1   Amylase    Component Value Date/Time   AMYLASE 362* 08/03/2014 0428   Lipase     Component Value Date/Time   LIPASE 279* 08/03/2014 0428    Physical Exam General appearance: alert and no distress Resp: clear to auscultation bilaterally Cardio: regular rate and rhythm GI: normal findings: bowel sounds normal and soft   Assessment/Plan: Fall Pancreatitis -- Appears likely alcoholic etiology although biliary is also possibility given GB sludge on US. Should probably get elective cholecystectomy after acute illness resolves. Check enzymes tomorrow. EtOH use -- SW to assess FEN -- Will give clears, decrease IVF slightly. Will try orals for pain but MD may want to adjust pain medication regimen VTE -- SCD's, Lovenox Dispo -- As above    Freeman CaldronMichael J. Sabastian Raimondi, PA-C Pager: [phone] General Trauma PA Pager: [phone]  08/03/2014

## 2014-08-04 LAB — LIPASE, BLOOD: LIPASE: 179 U/L — AB (ref 11–59)

## 2014-08-04 LAB — AMYLASE: Amylase: 184 U/L — ABNORMAL HIGH (ref 0–105)

## 2014-08-04 MED ORDER — HYDROMORPHONE HCL 1 MG/ML IJ SOLN: 1.0000 mg | INTRAMUSCULAR | Status: DC | PRN

## 2014-08-04 NOTE — Progress Notes (Signed)
Patient ID: Darren Parrish, male   DOB: 02/16/1981, 34 y.o.   MRN: [id number]   LOS: 3 days   Subjective: Postprandial pain in epigastic area with clear liquids.  Back pain stable.  Pain control adequate.  Pt states he is ambulating periodically throughout the day.  No BM since bloody stool yesterday afternoon.   Objective: Vital signs in last 24 hours: Temp:  [98.2 F (36.8 C)-99.3 F (37.4 C)] 98.3 F (36.8 C) (02/18 0546) Pulse Rate:  [80-90] 80 (02/18 0546) Resp:  [14-17] 16 (02/18 0546) BP: (130-154)/(73-94) 130/78 mmHg (02/18 0546) SpO2:  [98 %-100 %] 100 % (02/18 0546) Last BM Date: 08/03/14   Laboratory  CBC No results for input(s): WBC, HGB, HCT, PLT in the last 72 hours. BMET  Recent Labs  08/02/14 0338 08/03/14 0428  NA 136 136  K 4.4 4.3  CL 103 101  CO2 21 24  GLUCOSE 61* 47*  BUN 8 7  CREATININE 0.96 0.96  CALCIUM 8.5 9.1     Physical Exam General appearance: alert and no distress Resp: clear to auscultation bilaterally Cardio: regular rate and rhythm GI: soft, NT, nondistended, normal bowel sounds Pulses: 2+ and symmetric   Assessment/Plan: Fall Pancreatitis -- Appears likely alcoholic etiology although biliary is also possibility given GB sludge on US. Should probably get elective cholecystectomy after acute illness resolves. Enzymes continue to trend down EtOH use -- SW to assess FEN -- Pain control adequate; concerning postprandial pain with clears; will discuss diet advancement with MD VTE -- SCD's, Lovenox Dispo -- As above  Emilie RutterMatthew Lavaris Sexson PA-S 08/04/2014

## 2014-08-05 NOTE — Progress Notes (Signed)
Patient ID: Darren SellersRyan B Parrish, male   DOB: 02/23/1981, 34 y.o.   MRN: [id number]   LOS: 4 days   Subjective: No new complaints this am.  +Flatus.  Not as much postprandial pain.  No BM since bloody stool still   Objective: Vital signs in last 24 hours: Temp:  [98 F (36.7 C)-98.7 F (37.1 C)] 98 F (36.7 C) (02/19 0533) Pulse Rate:  [68-84] 68 (02/19 0533) Resp:  [16] 16 (02/19 0533) BP: (116-135)/(68-74) 123/74 mmHg (02/19 0533) SpO2:  [100 %] 100 % (02/19 0533) Last BM Date: 08/04/14   Laboratory  CBC No results for input(s): WBC, HGB, HCT, PLT in the last 72 hours. BMET  Recent Labs  08/03/14 0428  NA 136  K 4.3  CL 101  CO2 24  GLUCOSE 47*  BUN 7  CREATININE 0.96  CALCIUM 9.1     Physical Exam General appearance: alert and no distress Resp: clear to auscultation bilaterally Cardio: regular rate and rhythm GI: Soft, non tender to deep palpation, normal bowel sounds Pulses: 2+ and symmetric   Assessment/Plan: Fall Pancreatitis -- Appears likely alcoholic etiology although biliary is also possibility given GB sludge on US. Should probably get elective cholecystectomy after acute illness resolves.  Last enzymes drawn on 2/18 EtOH use -- SW to assess FEN -- Pain control adequate; will discuss diet advancement with MD; less postprandial epigastric pain VTE -- SCD's, Lovenox Dispo -- As above   Emilie RutterMatthew Avari Gelles PA-S 08/05/2014

## 2014-08-06 MED ORDER — OXYCODONE HCL 10 MG PO TABS: 10.0000 mg | ORAL_TABLET | ORAL | Status: DC | PRN

## 2014-08-06 NOTE — Discharge Instructions (Signed)
Stop drinking alcohol!  Acute Pancreatitis Acute pancreatitis is a disease in which the pancreas becomes suddenly inflamed. The pancreas is a large gland located behind your stomach. The pancreas produces enzymes that help digest food. The pancreas also releases the hormones glucagon and insulin that help regulate blood sugar. Damage to the pancreas occurs when the digestive enzymes from the pancreas are activated and begin attacking the pancreas before being released into the intestine. Most acute attacks last a couple of days and can cause serious complications. Some people become dehydrated and develop low blood pressure. In severe cases, bleeding into the pancreas can lead to shock and can be life-threatening. The lungs, heart, and kidneys may fail. CAUSES  Pancreatitis can happen to anyone. In some cases, the cause is unknown. Most cases are caused by:  Alcohol abuse.  Gallstones. Other less common causes are:  Certain medicines.  Exposure to certain chemicals.  Infection.  Damage caused by an accident (trauma).  Abdominal surgery. SYMPTOMS   Pain in the upper abdomen that may radiate to the back.  Tenderness and swelling of the abdomen.  Nausea and vomiting. DIAGNOSIS  Your caregiver will perform a physical exam. Blood and stool tests may be done to confirm the diagnosis. Imaging tests may also be done, such as X-rays, CT scans, or an ultrasound of the abdomen. TREATMENT  Treatment usually requires a stay in the hospital. Treatment may include:  Pain medicine.  Fluid replacement through an intravenous line (IV).  Placing a tube in the stomach to remove stomach contents and control vomiting.  Not eating for 3 or 4 days. This gives your pancreas a rest, because enzymes are not being produced that can cause further damage.  Antibiotic medicines if your condition is caused by an infection.  Surgery of the pancreas or gallbladder. HOME CARE INSTRUCTIONS   Follow the  diet advised by your caregiver. This may involve avoiding alcohol and decreasing the amount of fat in your diet.  Eat smaller, more frequent meals. This reduces the amount of digestive juices the pancreas produces.  Drink enough fluids to keep your urine clear or pale yellow.  Only take over-the-counter or prescription medicines as directed by your caregiver.  Avoid drinking alcohol if it caused your condition.  Do not smoke.  Get plenty of rest.  Check your blood sugar at home as directed by your caregiver.  Keep all follow-up appointments as directed by your caregiver. SEEK MEDICAL CARE IF:   You do not recover as quickly as expected.  You develop new or worsening symptoms.  You have persistent pain, weakness, or nausea.  You recover and then have another episode of pain. SEEK IMMEDIATE MEDICAL CARE IF:   You are unable to eat or keep fluids down.  Your pain becomes severe.  You have a fever or persistent symptoms for more than 2 to 3 days.  You have a fever and your symptoms suddenly get worse.  Your skin or the white part of your eyes turn yellow (jaundice).  You develop vomiting.  You feel dizzy, or you faint.  Your blood sugar is high (over 300 mg/dL). MAKE SURE YOU:   Understand these instructions.  Will watch your condition.  Will get help right away if you are not doing well or get worse. Document Released: 06/03/2005 Document Revised: 12/03/2011 Document Reviewed: 09/12/2011 [facility] Patient Information 2015 [employer] [place]. This information is not intended to replace advice given to you by your health care provider. Make sure you discuss  any questions you have with your health care provider. ° °

## 2014-08-06 NOTE — Progress Notes (Signed)
Discharge home. Home discharge instruction given, no questions verbalized. 

## 2014-08-09 ENCOUNTER — Telehealth (HOSPITAL_COMMUNITY): Payer: Self-pay

## 2014-08-09 NOTE — Telephone Encounter (Signed)
Scheduled an appointment for pt with Dr. Lindie SpruceWyatt on 3/8 and refilled rx for OxyIR 10mg , #30, and told them he needed to make those last until his appointment per Dr. Lindie SpruceWyatt.

## 2014-08-11 NOTE — Discharge Summary (Signed)
Physician Discharge Summary  Patient ID: Darren Parrish MRN: [id number] DOB/AGE: 32/12/1980 33 y.o.  Admit date: 07/31/2014 Discharge date: 08/06/2014  Discharge Diagnoses Patient Active Problem List   Diagnosis Date Noted  . Alcohol abuse 08/02/2014  . Pancreatitis, acute 08/01/2014  . Blunt trauma to abdomen 08/01/2014  . Fall 07/31/2014    Consultants None   Procedures None   HPI: [facility] He presented there after a fall that occurred the day before. He slipped at the top of the stairs and fell down approximately 25 stairs on his back. He hit only his back. He denied headache, loss of consciousness, or neck pain. He did not hit his abdomen. He awoke the next morning with moderate to severe left-sided abdominal pain. He had one episode of emesis.He admitted to drinking approximately 4-5 beers yesterday. His workup included a CT scan of the abdomen and pelvis that showed an abnormality near the tail of the pancreas. Traumatic etiology was favored and he was admitted for pain control and observation.   Hospital Course: The patient's pancreatic enzymes rose over the next few days and then started dropping again. He remained stable. His bilirubin also rose and an ultrasound showed gallbladder sludge but no other abnormality. Social work interviewed the patient and uncovered a pattern of alcoholism. Once his enzymes had started to decline his diet was gradually advanced and his pain was controlled on oral medications. It remained undetermined whether the etiology of his pancreatitis was alcoholic, biliary, or traumatic. He was discharged in improved condition. He will follow up in our office.      Medication List    TAKE these medications        Oxycodone HCl 10 MG Tabs  Take 1-2 tablets (10-20 mg total) by mouth every 4 (four) hours as needed (10mg  for mild pain, 15mg  for moderate pain, 20mg  for severe pain).            Follow-up Information    Follow up with WYATT, JAY, MD. Schedule an appointment as soon as possible for a visit in 3 weeks.   Specialty:  General Surgery   Why:  to discuss elective resection of your gallbladder   Contact information:   [address] [place] [address] [phone]        Signed: Freeman CaldronMichael J. Yechiel Erny, PA-C Pager: [phone] General Trauma PA Pager: [phone] 08/11/2014, 10:16 AM

## 2015-04-13 ENCOUNTER — Encounter (HOSPITAL_COMMUNITY): Payer: Self-pay | Admitting: Internal Medicine

## 2015-04-13 ENCOUNTER — Emergency Department (HOSPITAL_BASED_OUTPATIENT_CLINIC_OR_DEPARTMENT_OTHER)
Admission: EM | Admit: 2015-04-13 | Discharge: 2015-04-13 | Disposition: A | Discharge status: Home / Self Care | Payer: BLUE CROSS/BLUE SHIELD | Source: Home / Self Care | Attending: Emergency Medicine | Admitting: Emergency Medicine

## 2015-04-13 ENCOUNTER — Emergency Department (HOSPITAL_BASED_OUTPATIENT_CLINIC_OR_DEPARTMENT_OTHER): Payer: BLUE CROSS/BLUE SHIELD

## 2015-04-13 ENCOUNTER — Inpatient Hospital Stay (HOSPITAL_COMMUNITY)
Admission: AD | Admit: 2015-04-13 | Discharge: 2015-04-17 | DRG: 440 | Disposition: A | Discharge status: Home / Self Care | Payer: BLUE CROSS/BLUE SHIELD | Source: Ambulatory Visit | Attending: Internal Medicine | Admitting: Internal Medicine

## 2015-04-13 ENCOUNTER — Encounter (HOSPITAL_BASED_OUTPATIENT_CLINIC_OR_DEPARTMENT_OTHER): Payer: Self-pay

## 2015-04-13 DIAGNOSIS — Z79899 Other long term (current) drug therapy: Secondary | ICD-10-CM

## 2015-04-13 DIAGNOSIS — Z801 Family history of malignant neoplasm of trachea, bronchus and lung: Secondary | ICD-10-CM

## 2015-04-13 DIAGNOSIS — K860 Alcohol-induced chronic pancreatitis: Secondary | ICD-10-CM

## 2015-04-13 DIAGNOSIS — K852 Alcohol induced acute pancreatitis without necrosis or infection: Secondary | ICD-10-CM

## 2015-04-13 DIAGNOSIS — F1010 Alcohol abuse, uncomplicated: Secondary | ICD-10-CM

## 2015-04-13 DIAGNOSIS — Z841 Family history of disorders of kidney and ureter: Secondary | ICD-10-CM

## 2015-04-13 DIAGNOSIS — F17210 Nicotine dependence, cigarettes, uncomplicated: Secondary | ICD-10-CM | POA: Diagnosis present

## 2015-04-13 DIAGNOSIS — R109 Unspecified abdominal pain: Secondary | ICD-10-CM

## 2015-04-13 HISTORY — DX: Alcohol abuse, uncomplicated: F10.10

## 2015-04-13 HISTORY — DX: Acute pancreatitis without necrosis or infection, unspecified: K85.90

## 2015-04-13 LAB — CBC WITH DIFFERENTIAL/PLATELET
Basophils Absolute: 0 10*3/uL (ref 0.0–0.1)
Basophils Relative: 0 %
Eosinophils Absolute: 0.1 10*3/uL (ref 0.0–0.7)
Eosinophils Relative: 1 %
HEMATOCRIT: 40.8 % (ref 39.0–52.0)
Hemoglobin: 14.3 g/dL (ref 13.0–17.0)
LYMPHS PCT: 20 %
Lymphs Abs: 1.9 10*3/uL (ref 0.7–4.0)
MCH: 31.5 pg (ref 26.0–34.0)
MCHC: 35 g/dL (ref 30.0–36.0)
MCV: 89.9 fL (ref 78.0–100.0)
MONO ABS: 0.7 10*3/uL (ref 0.1–1.0)
Monocytes Relative: 8 %
NEUTROS ABS: 6.7 10*3/uL (ref 1.7–7.7)
Neutrophils Relative %: 71 %
Platelets: 299 10*3/uL (ref 150–400)
RBC: 4.54 MIL/uL (ref 4.22–5.81)
RDW: 12.1 % (ref 11.5–15.5)
WBC: 9.5 10*3/uL (ref 4.0–10.5)

## 2015-04-13 LAB — RAPID URINE DRUG SCREEN, HOSP PERFORMED
AMPHETAMINES: NOT DETECTED
BENZODIAZEPINES: NOT DETECTED
Barbiturates: NOT DETECTED
Cocaine: NOT DETECTED
Opiates: POSITIVE — AB
Tetrahydrocannabinol: NOT DETECTED

## 2015-04-13 LAB — COMPREHENSIVE METABOLIC PANEL
ALT: 16 U/L — ABNORMAL LOW (ref 17–63)
ANION GAP: 7 (ref 5–15)
AST: 33 U/L (ref 15–41)
Albumin: 4.7 g/dL (ref 3.5–5.0)
Alkaline Phosphatase: 101 U/L (ref 38–126)
BUN: 10 mg/dL (ref 6–20)
CO2: 25 mmol/L (ref 22–32)
Calcium: 9.3 mg/dL (ref 8.9–10.3)
Chloride: 106 mmol/L (ref 101–111)
Creatinine, Ser: 0.8 mg/dL (ref 0.61–1.24)
Glucose, Bld: 103 mg/dL — ABNORMAL HIGH (ref 65–99)
POTASSIUM: 3.6 mmol/L (ref 3.5–5.1)
Sodium: 138 mmol/L (ref 135–145)
TOTAL PROTEIN: 7.4 g/dL (ref 6.5–8.1)
Total Bilirubin: 2.4 mg/dL — ABNORMAL HIGH (ref 0.3–1.2)

## 2015-04-13 LAB — CBC
HCT: 40.2 % (ref 39.0–52.0)
Hemoglobin: 14 g/dL (ref 13.0–17.0)
MCH: 31.7 pg (ref 26.0–34.0)
MCHC: 34.8 g/dL (ref 30.0–36.0)
MCV: 91.2 fL (ref 78.0–100.0)
PLATELETS: 257 10*3/uL (ref 150–400)
RBC: 4.41 MIL/uL (ref 4.22–5.81)
RDW: 12.6 % (ref 11.5–15.5)
WBC: 11.2 10*3/uL — ABNORMAL HIGH (ref 4.0–10.5)

## 2015-04-13 LAB — URINALYSIS, ROUTINE W REFLEX MICROSCOPIC
BILIRUBIN URINE: NEGATIVE
Glucose, UA: NEGATIVE mg/dL
Hgb urine dipstick: NEGATIVE
KETONES UR: NEGATIVE mg/dL
Leukocytes, UA: NEGATIVE
NITRITE: NEGATIVE
PH: 8.5 — AB (ref 5.0–8.0)
Protein, ur: 30 mg/dL — AB
Specific Gravity, Urine: 1.022 (ref 1.005–1.030)
Urobilinogen, UA: 1 mg/dL (ref 0.0–1.0)

## 2015-04-13 LAB — BASIC METABOLIC PANEL
ANION GAP: 8 (ref 5–15)
BUN: 10 mg/dL (ref 6–20)
CALCIUM: 8.8 mg/dL — AB (ref 8.9–10.3)
CO2: 24 mmol/L (ref 22–32)
Chloride: 105 mmol/L (ref 101–111)
Creatinine, Ser: 0.75 mg/dL (ref 0.61–1.24)
GFR calc Af Amer: >60 mL/min (ref >60)
GLUCOSE: 120 mg/dL — AB (ref 65–99)
POTASSIUM: 3.9 mmol/L (ref 3.5–5.1)
SODIUM: 137 mmol/L (ref 135–145)

## 2015-04-13 LAB — URINE MICROSCOPIC-ADD ON

## 2015-04-13 LAB — LIPASE, BLOOD: LIPASE: 134 U/L — AB (ref 11–51)

## 2015-04-13 MED ORDER — FOLIC ACID 1 MG PO TABS
1.0000 mg | ORAL_TABLET | Freq: Every day | ORAL | Status: DC
  Filled 2015-04-13: qty 1

## 2015-04-13 MED ORDER — GI COCKTAIL ~~LOC~~
30.0000 mL | Freq: Once | ORAL | Status: AC
  Administered 2015-04-13: 30 mL via ORAL
  Filled 2015-04-13: qty 30

## 2015-04-13 MED ORDER — THIAMINE HCL 100 MG/ML IJ SOLN
Freq: Once | INTRAVENOUS | Status: AC
  Administered 2015-04-13: 20:00:00 via INTRAVENOUS
  Filled 2015-04-13: qty 1000

## 2015-04-13 MED ORDER — HYDROMORPHONE HCL 1 MG/ML IJ SOLN
1.0000 mg | Freq: Once | INTRAMUSCULAR | Status: AC
  Administered 2015-04-13: 1 mg via INTRAVENOUS
  Filled 2015-04-13: qty 1

## 2015-04-13 MED ORDER — HEPARIN SODIUM (PORCINE) 5000 UNIT/ML IJ SOLN
5000.0000 [IU] | Freq: Three times a day (TID) | INTRAMUSCULAR | Status: DC
  Administered 2015-04-13 – 2015-04-17 (×11): 5000 [IU] via SUBCUTANEOUS
  Filled 2015-04-13 (×14): qty 1

## 2015-04-13 MED ORDER — POLYETHYLENE GLYCOL 3350 17 G PO PACK: 17.0000 g | PACK | Freq: Every day | ORAL | Status: DC | PRN

## 2015-04-13 MED ORDER — DIPHENHYDRAMINE HCL 12.5 MG/5ML PO ELIX: 12.5000 mg | ORAL_SOLUTION | Freq: Four times a day (QID) | ORAL | Status: DC | PRN

## 2015-04-13 MED ORDER — ONDANSETRON HCL 4 MG PO TABS: 4.0000 mg | ORAL_TABLET | Freq: Four times a day (QID) | ORAL | Status: DC | PRN

## 2015-04-13 MED ORDER — HYDROMORPHONE 1 MG/ML IV SOLN
INTRAVENOUS | Status: DC
  Administered 2015-04-13: 1 mg via INTRAVENOUS
  Administered 2015-04-14: 2.1 mg via INTRAVENOUS
  Administered 2015-04-14: 1.2 mg via INTRAVENOUS
  Administered 2015-04-14 (×2): 2.7 mg via INTRAVENOUS
  Administered 2015-04-14: 4.5 mg via INTRAVENOUS
  Administered 2015-04-14: 18:00:00 via INTRAVENOUS
  Administered 2015-04-15: 2.7 mg via INTRAVENOUS
  Administered 2015-04-15 (×2): 2.4 mg via INTRAVENOUS
  Administered 2015-04-15: 3 mg via INTRAVENOUS
  Administered 2015-04-15: 4.2 mg via INTRAVENOUS
  Administered 2015-04-15: 3 mg via INTRAVENOUS
  Administered 2015-04-16: 2.4 mg via INTRAVENOUS
  Administered 2015-04-16: 3.3 mg via INTRAVENOUS
  Filled 2015-04-13 (×3): qty 25

## 2015-04-13 MED ORDER — VITAMIN B-1 100 MG PO TABS
100.0000 mg | ORAL_TABLET | Freq: Every day | ORAL | Status: DC
  Filled 2015-04-13: qty 1

## 2015-04-13 MED ORDER — HYDROMORPHONE HCL 1 MG/ML IJ SOLN
INTRAMUSCULAR | Status: AC
  Filled 2015-04-13: qty 1

## 2015-04-13 MED ORDER — DIPHENHYDRAMINE HCL 50 MG/ML IJ SOLN: 12.5000 mg | Freq: Four times a day (QID) | INTRAMUSCULAR | Status: DC | PRN

## 2015-04-13 MED ORDER — SODIUM CHLORIDE 0.9 % IV BOLUS (SEPSIS)
1000.0000 mL | Freq: Once | INTRAVENOUS | Status: AC
  Administered 2015-04-13: 1000 mL via INTRAVENOUS

## 2015-04-13 MED ORDER — LORAZEPAM 2 MG/ML IJ SOLN: 1.0000 mg | Freq: Four times a day (QID) | INTRAMUSCULAR | Status: DC | PRN

## 2015-04-13 MED ORDER — HYDROMORPHONE HCL 1 MG/ML IJ SOLN
0.5000 mg | Freq: Once | INTRAMUSCULAR | Status: AC
  Administered 2015-04-13: 0.5 mg via INTRAVENOUS

## 2015-04-13 MED ORDER — CETYLPYRIDINIUM CHLORIDE 0.05 % MT LIQD: 7.0000 mL | Freq: Two times a day (BID) | OROMUCOSAL | Status: DC

## 2015-04-13 MED ORDER — THIAMINE HCL 100 MG/ML IJ SOLN
100.0000 mg | Freq: Every day | INTRAMUSCULAR | Status: DC
  Administered 2015-04-14 – 2015-04-17 (×4): 100 mg via INTRAVENOUS
  Filled 2015-04-13 (×5): qty 1

## 2015-04-13 MED ORDER — ONDANSETRON HCL 4 MG/2ML IJ SOLN: 4.0000 mg | Freq: Four times a day (QID) | INTRAMUSCULAR | Status: DC | PRN

## 2015-04-13 MED ORDER — SODIUM CHLORIDE 0.9 % IJ SOLN: 9.0000 mL | INTRAMUSCULAR | Status: DC | PRN

## 2015-04-13 MED ORDER — MORPHINE SULFATE (PF) 4 MG/ML IV SOLN
4.0000 mg | Freq: Once | INTRAVENOUS | Status: AC
  Administered 2015-04-13: 4 mg via INTRAVENOUS
  Filled 2015-04-13: qty 1

## 2015-04-13 MED ORDER — SODIUM CHLORIDE 0.9 % IV BOLUS (SEPSIS): 1000.0000 mL | Freq: Once | INTRAVENOUS | Status: AC

## 2015-04-13 MED ORDER — NALOXONE HCL 0.4 MG/ML IJ SOLN: 0.4000 mg | INTRAMUSCULAR | Status: DC | PRN

## 2015-04-13 MED ORDER — SODIUM CHLORIDE 0.9 % IV SOLN
INTRAVENOUS | Status: DC
  Administered 2015-04-14 (×2): via INTRAVENOUS
  Administered 2015-04-15: 125 mL/h via INTRAVENOUS
  Administered 2015-04-15: 1000 mL via INTRAVENOUS
  Administered 2015-04-15 – 2015-04-16 (×3): via INTRAVENOUS

## 2015-04-13 MED ORDER — CHLORHEXIDINE GLUCONATE 0.12 % MT SOLN: 15.0000 mL | Freq: Two times a day (BID) | OROMUCOSAL | Status: DC

## 2015-04-13 MED ORDER — ADULT MULTIVITAMIN W/MINERALS CH
1.0000 | ORAL_TABLET | Freq: Every day | ORAL | Status: DC
  Filled 2015-04-13: qty 1

## 2015-04-13 MED ORDER — LORAZEPAM 1 MG PO TABS: 1.0000 mg | ORAL_TABLET | Freq: Four times a day (QID) | ORAL | Status: AC | PRN

## 2015-04-13 NOTE — ED Notes (Signed)
Pt states "Morphine doesn't work for me, I need something stronger!" explained to pt that md has ordered morphine for his pain, pt states "I'll take it but it won't work for me." when this rn is pushing morphine slowly from distal port, pt states "I want you to push it from here (indicating proximal port) and push it faster or it won't work at all!" explained to pt that I push narcotics from the distal port slowly to minimize side effects. Pt states "It isn't working at all..."

## 2015-04-13 NOTE — ED Notes (Signed)
Pt and sig other updated on lab and u/s results. Pt states he is still having pain and states "If I don't get something stronger I'm going to walk out of here!" pt encouraged to await md eval of his test results.

## 2015-04-13 NOTE — ED Notes (Signed)
Pt c/o center upper abdominal pain radiating to lt side with nausea since 330am; states took a 10mg  oxycodone 2hrs ago with no relief

## 2015-04-13 NOTE — ED Notes (Signed)
Dr. Loretha Staplerwofford made aware of pt leaving ama.

## 2015-04-13 NOTE — ED Notes (Addendum)
Pt calls this rn into room, states [facility], this is bullshit, I've been sitting here all day in 10/10 pain and you people are not helping me!" informed pt again of lab and u/s results, encouraged pt to stay and talk with dr. Loretha StaplerWofford, pt states "If you don't take this iv out I'm going to rip it out myself!" iv d/c tip intact, dsd placed. Pt agrees to sign out ama, states he is going to [facility] or [facility] "to find out what is really going on." pt instructed not to drive, pt sig other states she is driving him. pt dresses himself and walks out of ER with quick steady gait in nad with his sig other, refuses d/c vital signs.

## 2015-04-13 NOTE — ED Provider Notes (Signed)
CSN: [id number]     Arrival date & time 04/13/15  0865 History   First MD Initiated Contact with Patient 04/13/15 [phone]     Chief Complaint  Patient presents with  . Abdominal Pain     (Consider location/radiation/quality/duration/timing/severity/associated sxs/prior Treatment) Patient is a 34 y.o. male presenting with abdominal pain.  Abdominal Pain Pain location:  Epigastric and LUQ Pain quality: stabbing   Pain radiates to:  Does not radiate Pain severity:  Severe Onset quality:  Sudden Duration:  6 hours Timing:  Constant Progression:  Worsening Chronicity:  Recurrent Context comment:  Feels similar to when he had pancreatitis 8 months ago Relieved by:  Nothing Worsened by:  Nothing tried Ineffective treatments: oxycodone. Associated symptoms: no chest pain, no constipation, no diarrhea, no fever, no hematuria, no nausea and no vomiting     History reviewed. No pertinent past medical history. Past Surgical History  Procedure Laterality Date  .  thumb surgery Right   . Wisdom tooth extraction Bilateral    No family history on file. Social History  Substance Use Topics  . Smoking status: Current Some Day Smoker  . Smokeless tobacco: Current User  . Alcohol Use: Yes    Review of Systems  Constitutional: Negative for fever.  Cardiovascular: Negative for chest pain.  Gastrointestinal: Positive for abdominal pain. Negative for nausea, vomiting, diarrhea and constipation.  Genitourinary: Negative for hematuria.  All other systems reviewed and are negative.     Allergies  Codeine  Home Medications   Prior to Admission medications   Medication Sig Start Date End Date Taking? Authorizing Provider  oxyCODONE 10 MG TABS Take 1-2 tablets (10-20 mg total) by mouth every 4 (four) hours as needed (  for mild pain,  for moderate pain,  for severe pain). 08/06/14   Barnetta Chapel, PA-C   BP 122/95 mmHg  Pulse 75  Temp(Src) 97.8 F (36.6 C) (Oral)  Resp 20   Ht  (1.727 m)  Wt 135 lb (61.236 kg)  BMI 20.53 kg/m2  SpO2 100% Physical Exam  Constitutional: He is oriented to person, place, and time. He appears well-developed and well-nourished. No distress.  HENT:  Head: Normocephalic and atraumatic.  Mouth/Throat: Oropharynx is clear and moist.  Eyes: Conjunctivae are normal. Pupils are equal, round, and reactive to light. No scleral icterus.  Neck: Neck supple.  Cardiovascular: Normal rate, regular rhythm, normal heart sounds and intact distal pulses.   No murmur heard. Pulmonary/Chest: Effort normal and breath sounds normal. No stridor. No respiratory distress. He has no wheezes. He has no rales.  Abdominal: Soft. He exhibits no distension. There is tenderness (worst in LUQ) in the epigastric area, left upper quadrant and left lower quadrant. There is no rigidity, no rebound and no guarding.  Musculoskeletal: Normal range of motion. He exhibits no edema.  Neurological: He is alert and oriented to person, place, and time.  Skin: Skin is warm and dry. No rash noted.  Psychiatric: He has a normal mood and affect. His behavior is normal.  Nursing note and vitals reviewed.   ED Course  Procedures (including critical care time) Labs Review Labs Reviewed  COMPREHENSIVE METABOLIC PANEL - Abnormal; Notable for the following:    Glucose, Bld 103 (*)    ALT 16 (*)    Total Bilirubin 2.4 (*)    All other components within normal limits  LIPASE, BLOOD - Abnormal; Notable for the following:    Lipase 134 (*)    All other components within  normal limits  URINALYSIS, ROUTINE W REFLEX MICROSCOPIC (NOT AT [facility]) - Abnormal; Notable for the following:    pH 8.5 (*)    Protein, ur 30 (*)    All other components within normal limits  URINE RAPID DRUG SCREEN, HOSP PERFORMED - Abnormal; Notable for the following:    Opiates POSITIVE (*)    All other components within normal limits  CBC WITH DIFFERENTIAL/PLATELET  URINE MICROSCOPIC-ADD ON     Imaging Review [place] Abdomen Complete  04/13/2015  CLINICAL DATA:  Severe epigastric pain with nausea and vomiting. History of ethanol abuse and pancreatitis EXAM: ULTRASOUND ABDOMEN COMPLETE COMPARISON:  Abdominal CT 07/31/2014 FINDINGS: Gallbladder: Distended gallbladder without inflammatory features, focal tenderness, or stone. Common bile duct: Diameter: 4 mm Liver: No focal lesion identified. Within normal limits in parenchymal echogenicity. Antegrade flow in the imaged portal venous system. IVC: Negative grayscale appearance. Pancreas: Limited visualization with shadowing over the head. No evidence of fluid collection or ductal enlargement. Spleen: Size and appearance within normal limits. Right Kidney: Length: 10.5 cm. Echogenicity within normal limits. No mass or hydronephrosis visualized. Left Kidney: Length: 10.4 cm. Echogenicity within normal limits. No mass or hydronephrosis visualized. Abdominal aorta: No aneurysm visualized. IMPRESSION: Negative abdominal ultrasound. Electronically Signed   By: Marnee SpringJonathon  Watts M.D.   On: 04/13/2015 11:05   I have personally reviewed and evaluated these images and lab results as part of my medical decision-making.   EKG Interpretation None      MDM   Final diagnoses:  Abdominal pain  Alcohol-induced acute pancreatitis, unspecified complication status    34 year old male presenting with left upper quadrant abdominal pain. He thinks it feels similar to when he had pancreatitis. He does admit to drinking alcohol last night, prior to the onset of his symptoms.    4:21 PM Pt's workup showed mildly elevated lipase.  I suspected that his pain was secondary to developing acute pancreatitis.  He also had elevated tBili, so US was ordered.  It was negative.  His pain was treated first with IV morphine and then IV dilaudid.  He also received IV fluids.  We discussed a plan to try to achieved pain control and if unsuccessful, to admit to the hospital.     During patient's ED course, he displayed a number of concerning behavioral red flags.  He frequently asked for more and more narcotic pain medications even just after receiving a dose, he requested fast IV pushes, specifically from proximal IV ports, and he became angry towards nursing staff.    Regardless, he had evidence of an acute painful condition and attempts were made to control his pain with narcotics and non-narcotic pain medications.  Ultimately, despite our discussion about admission, he left the ED AMA.  He refused nursing attempts to have him stay until I could further discuss his care with him.    He subsequently presented to his PCP's office.  His PCP and I had a conversation about his case and his PCP will further direct his care.    Blake DivineJohn Kylyn Mcdade, MD 04/13/15 1630

## 2015-04-13 NOTE — H&P (Signed)
[facility] History and Physical     History and Physical:    Darren Parrish   ZOX:[id number] DOB: 10/27/1980 DOA: 04/13/2015  Referring MD/provider: Dr. Nelda Severefreid PCP: Lenora BoysFRIED, ROBERT L, MD   Chief Complaint: abd pain  History of Present Illness:   Darren Parrish is an 34 y.o. male with past medical history of traumatic pancreatitis on February 2016, He relates that the day prior to his visit to [facility] he had about 6 beers that night he said started having abdominal pain that woke him up in the middle of the night, which progressively got worst with nausea and vomiting. His pain he relates radiated to his back, made worse by food and water He has minimum to keep water down. that went to the [facility] emergency room on 04/12/2015 left AMA as he felt they were not paying attention to him.  He went to see his primary care doctor referred him for direct admission.    ROS:   ROS  Constitutional: No fever, no chills;  Appetite normal; No weight loss, no weight gain, no fatigue.   HEENT: No blurry vision, no diplopia, no pharyngitis, no dysphagia  CV: No chest pain, no palpitations, no PND, no orthopnea, no edema.   Resp: No SOB, no cough, no pleuritic pain.  GI:  no diarrhea, no melena, no hematochezia, no constipation, no abdominal pain.   GU: No dysuria, no hematuria, no frequency, no urgency.  MSK: No myalgias, no arthralgias.   Neuro:  No headache, no focal neurological deficits, no history of seizures.   Psych: No depression, no anxiety.   Endo: No heat intolerance, no cold intolerance, no polyuria, no polydipsia   Skin: No rashes, no skin lesions.   Heme: No easy bruising.   Travel history: No recent travel.   Past Medical History:   Past Medical History  Diagnosis Date  . Pancreatitis   . Chronic alcohol abuse     Past Surgical History:   Past Surgical History  Procedure Laterality Date  .  thumb surgery Right   . Wisdom tooth extraction  Bilateral     Social History:   Social History   Social History  . Marital Status: Married    Spouse Name: N/A  . Number of Children: N/A  . Years of Education: N/A   Occupational History  . Not on file.   Social History Main Topics  . Smoking status: Current Some Day Smoker -- 1.00 packs/day    Types: E-cigarettes  . Smokeless tobacco: Current User  . Alcohol Use: 3.6 oz/week    6 Cans of beer per week     Comment: chronic alcohol abuse  . Drug Use: No  . Sexual Activity: Yes   Other Topics Concern  . Not on file   Social History Narrative    Family history:   Family History  Problem Relation Age of Onset  . Lung cancer Mother   . Kidney failure Father     Allergies   Codeine  Current Medications:   Prior to Admission medications   Medication Sig Start Date End Date Taking? Authorizing Provider  esomeprazole (NEXIUM) 20 MG capsule Take 20 mg by mouth daily as needed (acid reflux).   Yes Historical Provider, MD  oxyCODONE (ROXICODONE) 15 MG immediate release tablet Take 15 mg by mouth every 4 (four) hours as needed for pain.  03/20/15  Yes Historical Provider, MD  senna (SENOKOT) 8.6 MG tablet Take  2 tablets by mouth daily as needed for constipation.   Yes Historical Provider, MD  oxyCODONE 10 MG TABS Take 1-2 tablets (10-20 mg total) by mouth every 4 (four) hours as needed (  for mild pain,  for moderate pain,  for severe pain). 08/06/14   Barnetta Chapel, PA-C    Physical Exam:   There were no vitals filed for this visit.   Physical Exam: There were no vitals taken for this visit. Gen: No acute distress. Head: Normocephalic, atraumatic. Eyes: PERRL, EOMI, sclerae nonicteric. Mouth: Oropharynx Neck: Supple, no thyromegaly, no lymphadenopathy, no jugular venous distention. Chest: Good air movement clear to auscultation CV: Regular rate and rhythm Abdomen: Soft, diffuse tenderness, but severe epigastric tenderness to deep palpation,  nondistended with normal active bowel sounds. Extremities: Extremities Skin: Warm and dry. Neuro: Alert and oriented times 3; cranial nerves II through XII grossly intact. Psych: Mood and affect normal.   Data Review:    Labs: Basic Metabolic Panel:  Recent Labs Lab 04/13/15 0740  NA 138  K 3.6  CL 106  CO2 25  GLUCOSE 103*  BUN 10  CREATININE 0.80  CALCIUM 9.3   Liver Function Tests:  Recent Labs Lab 04/13/15 0740  AST 33  ALT 16*  ALKPHOS 101  BILITOT 2.4*  PROT 7.4  ALBUMIN 4.7    Recent Labs Lab 04/13/15 0740  LIPASE 134*   No results for input(s): AMMONIA in the last 168 hours. CBC:  Recent Labs Lab 04/13/15 0740  WBC 9.5  NEUTROABS 6.7  HGB 14.3  HCT 40.8  MCV 89.9  PLT 299   Cardiac Enzymes: No results for input(s): CKTOTAL, CKMB, CKMBINDEX, TROPONINI in the last 168 hours.  BNP (last 3 results) No results for input(s): PROBNP in the last 8760 hours. CBG: No results for input(s): GLUCAP in the last 168 hours.  Radiographic Studies: US Abdomen Complete  04/13/2015  CLINICAL DATA:  Severe epigastric pain with nausea and vomiting. History of ethanol abuse and pancreatitis EXAM: ULTRASOUND ABDOMEN COMPLETE COMPARISON:  Abdominal CT 07/31/2014 FINDINGS: Gallbladder: Distended gallbladder without inflammatory features, focal tenderness, or stone. Common bile duct: Diameter: 4 mm Liver: No focal lesion identified. Within normal limits in parenchymal echogenicity. Antegrade flow in the imaged portal venous system. IVC: Negative grayscale appearance. Pancreas: Limited visualization with shadowing over the head. No evidence of fluid collection or ductal enlargement. Spleen: Size and appearance within normal limits. Right Kidney: Length: 10.5 cm. Echogenicity within normal limits. No mass or hydronephrosis visualized. Left Kidney: Length: 10.4 cm. Echogenicity within normal limits. No mass or hydronephrosis visualized. Abdominal aorta: No aneurysm  visualized. IMPRESSION: Negative abdominal ultrasound. Electronically Signed   By: Marnee Spring M.D.   On: 04/13/2015 11:05   *I have personally reviewed the images above*  EKG: Independently reviewednone   Assessment/Plan:   Alcoholic pancreatitis - This likely alcohol induced. Classical for acute pancreatitis after binge drinking, with epigastric abdominal pain which radiates to his back. - Place him nothing by mouth start him on IV fluid hydration and a PCA pump monitor strict I's and O's.  - Please not even ice chips, Zofran for nausea.   Alcohol abuse Monitor CIWA protocol started on thiamine and folate.  DVT prophylaxis  Code Status: Full. Family Communication: none Disposition Plan: Home when stable.  Time spent: 65 min  Marinda Elk [facility] Pager [phone]  If 7PM-7AM, please contact night-coverage [email or web address] Password [email or web address] 04/13/2015, 4:41 PM

## 2015-04-13 NOTE — ED Notes (Signed)
Pt sitting up in bed, talking with sig other and watching tv, rates his pain at 8/10, requests "something stronger" for his pain. md aware.

## 2015-04-14 LAB — BASIC METABOLIC PANEL
Anion gap: 6 (ref 5–15)
BUN: 9 mg/dL (ref 6–20)
CHLORIDE: 104 mmol/L (ref 101–111)
CO2: 30 mmol/L (ref 22–32)
CREATININE: 0.97 mg/dL (ref 0.61–1.24)
Calcium: 8.9 mg/dL (ref 8.9–10.3)
GFR calc non Af Amer: >60 mL/min (ref >60)
Glucose, Bld: 100 mg/dL — ABNORMAL HIGH (ref 65–99)
POTASSIUM: 4.7 mmol/L (ref 3.5–5.1)
Sodium: 140 mmol/L (ref 135–145)

## 2015-04-14 LAB — CBC
HEMATOCRIT: 42.8 % (ref 39.0–52.0)
HEMOGLOBIN: 14.5 g/dL (ref 13.0–17.0)
MCH: 31.8 pg (ref 26.0–34.0)
MCHC: 33.9 g/dL (ref 30.0–36.0)
MCV: 93.9 fL (ref 78.0–100.0)
Platelets: 235 10*3/uL (ref 150–400)
RBC: 4.56 MIL/uL (ref 4.22–5.81)
RDW: 13 % (ref 11.5–15.5)
WBC: 10.7 10*3/uL — ABNORMAL HIGH (ref 4.0–10.5)

## 2015-04-14 NOTE — Progress Notes (Signed)
[facility] PROGRESS NOTE    Progress Note   Darren Parrish [id number] DOB: 03/09/1981 DOA: 04/13/2015 PCP: Lenora BoysFRIED, ROBERT L, MD   Brief Narrative:   Darren SellersRyan B Parrish is an 34 y.o. male past medical history traumatic pancreatitis comes in for abdominal pain after binge drinking.  Assessment/Plan:   Principal Problem:   Alcoholic pancreatitis Active Problems:   Alcohol abuse  Pain is controlled with a PCA pump, I will continue the patient nothing by mouth not even I sips, continue IV fluid hydration he's had good urine output. He relates he is still not hungry. He relates his pain is well controlled.    DVT Prophylaxis - Lovenox ordered.  Family Communication: wife Disposition Plan: Home when stable. Code Status:     Code Status Orders        Start     Ordered   04/13/15 1637  Full code   Continuous     04/13/15 1636        IV Access:    Peripheral IV   Procedures and diagnostic studies:   [place] Abdomen Complete  04/13/2015  CLINICAL DATA:  Severe epigastric pain with nausea and vomiting. History of ethanol abuse and pancreatitis EXAM: ULTRASOUND ABDOMEN COMPLETE COMPARISON:  Abdominal CT 07/31/2014 FINDINGS: Gallbladder: Distended gallbladder without inflammatory features, focal tenderness, or stone. Common bile duct: Diameter: 4 mm Liver: No focal lesion identified. Within normal limits in parenchymal echogenicity. Antegrade flow in the imaged portal venous system. IVC: Negative grayscale appearance. Pancreas: Limited visualization with shadowing over the head. No evidence of fluid collection or ductal enlargement. Spleen: Size and appearance within normal limits. Right Kidney: Length: 10.5 cm. Echogenicity within normal limits. No mass or hydronephrosis visualized. Left Kidney: Length: 10.4 cm. Echogenicity within normal limits. No mass or hydronephrosis visualized. Abdominal aorta: No aneurysm visualized. IMPRESSION: Negative abdominal ultrasound.  Electronically Signed   By: Marnee SpringJonathon  Watts M.D.   On: 04/13/2015 11:05     Medical Consultants:    None.  Anti-Infectives:   Anti-infectives    None      Subjective:    Darren Parrish relates his pain is stable.  Objective:    Filed Vitals:   04/14/15 0800 04/14/15 1100 04/14/15 1118 04/14/15 1154  BP: 142/92 130/80  128/80  Pulse: 99 95  88  Temp:    99 F (37.2 C)  TempSrc:    Oral  Resp: 12  14 15   Height:      Weight:      SpO2: 98%   99%    Intake/Output Summary (Last 24 hours) at 04/14/15 1318 Last data filed at 04/14/15 1100  Gross per 24 hour  Intake 3043.33 ml  Output    400 ml  Net 2643.33 ml   Filed Weights   04/13/15 1652  Weight: 61.236 kg (135 lb)    Exam: Gen:  NAD Cardiovascular:  RRR, No M/R/G Chest and lungs:   CTAB Abdomen:  Abdomen epigastric pain, NT/ND, + BS Extremities:  No C/E/C   Data Reviewed:    Labs: Basic Metabolic Panel:  Recent Labs Lab 04/13/15 0740 04/13/15 1912 04/14/15 0520  NA 138 137 140  K 3.6 3.9 4.7  CL 106 105 104  CO2 25 24 30   GLUCOSE 103* 120* 100*  BUN 10 10 9   CREATININE 0.80 0.75 0.97  CALCIUM 9.3 8.8* 8.9   GFR Estimated Creatinine Clearance: 94.6 mL/min (by C-G formula based on Cr of 0.97). Liver Function Tests:  Recent  Labs Lab 04/13/15 0740  AST 33  ALT 16*  ALKPHOS 101  BILITOT 2.4*  PROT 7.4  ALBUMIN 4.7    Recent Labs Lab 04/13/15 0740  LIPASE 134*   No results for input(s): AMMONIA in the last 168 hours. Coagulation profile No results for input(s): INR, PROTIME in the last 168 hours.  CBC:  Recent Labs Lab 04/13/15 0740 04/13/15 1912 04/14/15 0520  WBC 9.5 11.2* 10.7*  NEUTROABS 6.7  --   --   HGB 14.3 14.0 14.5  HCT 40.8 40.2 42.8  MCV 89.9 91.2 93.9  PLT 299 257 235   Cardiac Enzymes: No results for input(s): CKTOTAL, CKMB, CKMBINDEX, TROPONINI in the last 168 hours. BNP (last 3 results) No results for input(s): PROBNP in the last 8760  hours. CBG: No results for input(s): GLUCAP in the last 168 hours. D-Dimer: No results for input(s): DDIMER in the last 72 hours. Hgb A1c: No results for input(s): HGBA1C in the last 72 hours. Lipid Profile: No results for input(s): CHOL, HDL, LDLCALC, TRIG, CHOLHDL, LDLDIRECT in the last 72 hours. Thyroid function studies: No results for input(s): TSH, T4TOTAL, T3FREE, THYROIDAB in the last 72 hours.  Invalid input(s): FREET3 Anemia work up: No results for input(s): VITAMINB12, FOLATE, FERRITIN, TIBC, IRON, RETICCTPCT in the last 72 hours. Sepsis Labs:  Recent Labs Lab 04/13/15 0740 04/13/15 1912 04/14/15 0520  WBC 9.5 11.2* 10.7*   Microbiology No results found for this or any previous visit (from the past 240 hour(s)).   Medications:   . heparin  5,000 Units Subcutaneous 3 times per day  . HYDROmorphone   Intravenous 6 times per day  . thiamine  100 mg Intravenous Daily   Continuous Infusions: . sodium chloride 125 mL/hr at 04/14/15 1117    Time spent: 15 min   LOS: 1 day   Marinda Elk  [facility] Pager [phone]  *Please refer to [email or web address], password [email or web address] to get updated schedule on who will round on this patient, as hospitalists switch teams weekly. If 7PM-7AM, please contact night-coverage at [email or web address], password [email or web address] for any overnight needs.  04/14/2015, 1:18 PM

## 2015-04-15 MED ORDER — POLYETHYLENE GLYCOL 3350 17 G PO PACK
17.0000 g | PACK | Freq: Once | ORAL | Status: AC
  Administered 2015-04-15: 17 g via ORAL
  Filled 2015-04-15: qty 1

## 2015-04-15 NOTE — Progress Notes (Signed)
[facility] PROGRESS NOTE    Progress Note   Darren Parrish [id number] DOB: 18-Jun-1980 DOA: 04/13/2015 PCP: Lenora Boys, MD   Brief Narrative:   Darren Parrish is an 34 y.o. male past medical history traumatic pancreatitis comes in for abdominal pain after binge drinking, dx with acute pancreatitis  Assessment/Plan:   Principal Problem:   Alcoholic pancreatitis Active Problems:   Alcohol abuse  Pain is controlled with a PCA pump, I will continue the patient nothing may have ice chips. If pain develops, will have to place NPO.    DVT Prophylaxis - Lovenox ordered.  Family Communication: wife Disposition Plan: Home when stable. Code Status:     Code Status Orders        Start     Ordered   04/13/15 1637  Full code   Continuous     04/13/15 1636        IV Access:    Peripheral IV   Procedures and diagnostic studies:   No results found.   Medical Consultants:    None.  Anti-Infectives:   Anti-infectives    None      Subjective:    Darren Parrish relates his pain is control.  Objective:    Filed Vitals:   04/14/15 2338 04/15/15 0624 04/15/15 0630 04/15/15 1217  BP: 131/76  114/65 122/68  Pulse: 102  80 78  Temp: 99.1 F (37.3 C)  98.4 F (36.9 C) 98.6 F (37 C)  TempSrc: Oral  Oral Oral  Resp: Height:      Weight:      SpO2: 99% 99% 98% 99%    Intake/Output Summary (Last 24 hours) at 04/15/15 1247 Last data filed at 04/15/15 0600  Gross per 24 hour  Intake   2500 ml  Output    825 ml  Net   1675 ml   Filed Weights   04/13/15 1652  Weight: 61.236 kg (135 lb)    Exam: Gen:  NAD Cardiovascular:  RRR, No M/R/G Chest and lungs:   CTAB Abdomen:  Abdomen Non tender, , ND, + BS Extremities:  No C/E/C   Data Reviewed:    Labs: Basic Metabolic Panel:  Recent Labs Lab 04/13/15 0740 04/13/15 1912 04/14/15 0520  NA 138 137 140  K 3.6 3.9 4.7  CL 106 105 104  CO2 GLUCOSE 103* 120*  100*  BUN CREATININE 0.80 0.75 0.97  CALCIUM 9.3 8.8* 8.9   GFR Estimated Creatinine Clearance: 94.6 mL/min (by C-G formula based on Cr of 0.97). Liver Function Tests:  Recent Labs Lab 04/13/15 0740  AST 33  ALT 16*  ALKPHOS 101  BILITOT 2.4*  PROT 7.4  ALBUMIN 4.7    Recent Labs Lab 04/13/15 0740  LIPASE 134*   No results for input(s): AMMONIA in the last 168 hours. Coagulation profile No results for input(s): INR, PROTIME in the last 168 hours.  CBC:  Recent Labs Lab 04/13/15 0740 04/13/15 1912 04/14/15 0520  WBC 9.5 11.2* 10.7*  NEUTROABS 6.7  --   --   HGB 14.3 14.0 14.5  HCT 40.8 40.2 42.8  MCV 89.9 91.2 93.9  PLT 299 257 235   Cardiac Enzymes: No results for input(s): CKTOTAL, CKMB, CKMBINDEX, TROPONINI in the last 168 hours. BNP (last 3 results) No results for input(s): PROBNP in the last 8760 hours. CBG: No results for input(s): GLUCAP in the last 168 hours.  D-Dimer: No results for input(s): DDIMER in the last 72 hours. Hgb A1c: No results for input(s): HGBA1C in the last 72 hours. Lipid Profile: No results for input(s): CHOL, HDL, LDLCALC, TRIG, CHOLHDL, LDLDIRECT in the last 72 hours. Thyroid function studies: No results for input(s): TSH, T4TOTAL, T3FREE, THYROIDAB in the last 72 hours.  Invalid input(s): FREET3 Anemia work up: No results for input(s): VITAMINB12, FOLATE, FERRITIN, TIBC, IRON, RETICCTPCT in the last 72 hours. Sepsis Labs:  Recent Labs Lab 04/13/15 0740 04/13/15 1912 04/14/15 0520  WBC 9.5 11.2* 10.7*   Microbiology No results found for this or any previous visit (from the past 240 hour(s)).   Medications:   . heparin  5,000 Units Subcutaneous 3 times per day  . HYDROmorphone   Intravenous 6 times per day  . thiamine  100 mg Intravenous Daily   Continuous Infusions: . sodium chloride 125 mL/hr (04/15/15 1124)    Time spent: 15 min   LOS: 2 days   Marinda ElkFELIZ ORTIZ, Temple Sporer  [facility] Pager  [phone]  *Please refer to [email or web address], password [email or web address] to get updated schedule on who will round on this patient, as hospitalists switch teams weekly. If 7PM-7AM, please contact night-coverage at [email or web address], password [email or web address] for any overnight needs.  04/15/2015, 12:47 PM

## 2015-04-16 LAB — BASIC METABOLIC PANEL
Anion gap: 6 (ref 5–15)
CALCIUM: 8.5 mg/dL — AB (ref 8.9–10.3)
CHLORIDE: 102 mmol/L (ref 101–111)
CO2: 30 mmol/L (ref 22–32)
CREATININE: 0.74 mg/dL (ref 0.61–1.24)
Glucose, Bld: 131 mg/dL — ABNORMAL HIGH (ref 65–99)
Potassium: 3 mmol/L — ABNORMAL LOW (ref 3.5–5.1)
SODIUM: 138 mmol/L (ref 135–145)

## 2015-04-16 MED ORDER — OXYCODONE HCL 5 MG PO TABS
10.0000 mg | ORAL_TABLET | ORAL | Status: DC | PRN
  Administered 2015-04-16 (×3): 15 mg via ORAL
  Administered 2015-04-17 (×3): 20 mg via ORAL
  Filled 2015-04-16: qty 3
  Filled 2015-04-16: qty 4
  Filled 2015-04-16: qty 3
  Filled 2015-04-16: qty 4
  Filled 2015-04-16: qty 3
  Filled 2015-04-16: qty 4

## 2015-04-16 NOTE — Progress Notes (Signed)
[facility] PROGRESS NOTE    Progress Note   ELIOR ROBINETTE [id number] DOB: [birth year] DOA: 04/13/2015 PCP: Abigail Miyamoto, MD   Brief Narrative:   Darren Parrish is an 34 y.o. male past medical history traumatic pancreatitis comes in for abdominal pain after binge drinking, dx with acute pancreatitis  Assessment/Plan:   Principal Problem:   Alcoholic pancreatitis Active Problems:   Alcohol abuse  Pt will like to try a diet today, will start with clear, d/c Narcotics. Check b-met in am.    DVT Prophylaxis - Lovenox ordered.  Family Communication: wife Disposition Plan: Home in am Code Status:     Code Status Orders        Start     Ordered   04/13/15 1637  Full code   Continuous     04/13/15 1636        IV Access:    Peripheral IV   Procedures and diagnostic studies:   No results found.   Medical Consultants:    None.  Anti-Infectives:   Anti-infectives    None      Subjective:    Adam Phenix relates his pain is control.  Objective:    Filed Vitals:   04/16/15 0150 04/16/15 0302 04/16/15 0542 04/16/15 0729  BP: 115/69  119/67   Pulse: 100  98   Temp: 100 F (37.8 C)  99.8 F (37.7 C)   TempSrc: Oral  Oral   Resp: 18 12 18    Height:      Weight:      SpO2: 99% 97% 100% 99%    Intake/Output Summary (Last 24 hours) at 04/16/15 1004 Last data filed at 04/16/15 0600  Gross per 24 hour  Intake   3240 ml  Output    350 ml  Net   2890 ml   Filed Weights   04/13/15 1652  Weight: 61.236 kg (135 lb)    Exam: Gen:  NAD Cardiovascular:  RRR, No M/R/G Chest and lungs:   CTAB Abdomen:  Abdomen Non tender, , ND, + BS Extremities:  No C/E/C   Data Reviewed:    Labs: Basic Metabolic Panel:  Recent Labs Lab 04/13/15 0740 04/13/15 1912 04/14/15 0520  NA 138 137 140  K 3.6 3.9 4.7  CL 106 105 104  CO2 25 24 30   GLUCOSE 103* 120* 100*  BUN 10 10 9   CREATININE 0.80 0.75 0.97  CALCIUM 9.3 8.8* 8.9    GFR Estimated Creatinine Clearance: 94.6 mL/min (by C-G formula based on Cr of 0.97). Liver Function Tests:  Recent Labs Lab 04/13/15 0740  AST 33  ALT 16*  ALKPHOS 101  BILITOT 2.4*  PROT 7.4  ALBUMIN 4.7    Recent Labs Lab 04/13/15 0740  LIPASE 134*   No results for input(s): AMMONIA in the last 168 hours. Coagulation profile No results for input(s): INR, PROTIME in the last 168 hours.  CBC:  Recent Labs Lab 04/13/15 0740 04/13/15 1912 04/14/15 0520  WBC 9.5 11.2* 10.7*  NEUTROABS 6.7  --   --   HGB 14.3 14.0 14.5  HCT 40.8 40.2 42.8  MCV 89.9 91.2 93.9  PLT 299 257 235   Cardiac Enzymes: No results for input(s): CKTOTAL, CKMB, CKMBINDEX, TROPONINI in the last 168 hours. BNP (last 3 results) No results for input(s): PROBNP in the last 8760 hours. CBG: No results for input(s): GLUCAP in the last 168 hours. D-Dimer: No results for input(s): DDIMER in the last 72  hours. Hgb A1c: No results for input(s): HGBA1C in the last 72 hours. Lipid Profile: No results for input(s): CHOL, HDL, LDLCALC, TRIG, CHOLHDL, LDLDIRECT in the last 72 hours. Thyroid function studies: No results for input(s): TSH, T4TOTAL, T3FREE, THYROIDAB in the last 72 hours.  Invalid input(s): FREET3 Anemia work up: No results for input(s): VITAMINB12, FOLATE, FERRITIN, TIBC, IRON, RETICCTPCT in the last 72 hours. Sepsis Labs:  Recent Labs Lab 04/13/15 0740 04/13/15 1912 04/14/15 0520  WBC 9.5 11.2* 10.7*   Microbiology No results found for this or any previous visit (from the past 240 hour(s)).   Medications:   . heparin  5,000 Units Subcutaneous 3 times per day  . HYDROmorphone   Intravenous 6 times per day  . thiamine  100 mg Intravenous Daily   Continuous Infusions: . sodium chloride 125 mL/hr at 04/16/15 0546    Time spent: 15 min   LOS: 3 days   Charlynne Cousins  [facility] Pager [phone]  *Please refer to [email or web address], password [email or web address] to get updated  schedule on who will round on this patient, as hospitalists switch teams weekly. If 7PM-7AM, please contact night-coverage at [email or web address], password [email or web address] for any overnight needs.  04/16/2015, 10:04 AM

## 2015-04-17 MED ORDER — POLYETHYLENE GLYCOL 3350 17 G PO PACK
17.0000 g | PACK | Freq: Every day | ORAL | Status: DC
  Administered 2015-04-17: 17 g via ORAL
  Filled 2015-04-17: qty 1

## 2015-04-17 MED ORDER — OXYCODONE HCL 5 MG PO TABS
20.0000 mg | ORAL_TABLET | ORAL | Status: DC | PRN
  Administered 2015-04-17: 20 mg via ORAL
  Filled 2015-04-17: qty 4

## 2015-04-17 MED ORDER — OXYCODONE HCL 10 MG PO TABS: 20.0000 mg | ORAL_TABLET | ORAL | Status: DC | PRN

## 2015-04-17 NOTE — Discharge Summary (Signed)
Physician Discharge Summary  Darren Parrish ZOX:[id number] DOB: 01/13/1981 DOA: 04/13/2015  PCP: Lenora BoysFRIED, ROBERT L, MD  Admit date: 04/13/2015 Discharge date: 04/17/2015  Time spent: 35 minutes  Recommendations for Outpatient Follow-up:  1. Follow up with PCP i 1-2 weeks.  Discharge Diagnoses:  Principal Problem:   Alcoholic pancreatitis Active Problems:   Alcohol abuse   Discharge Condition: stable  Diet recommendation: regular  Filed Weights   04/13/15 1652  Weight: 61.236 kg (135 lb)    History of present illness:  34 y.o. male with past medical history of traumatic pancreatitis on February 2016, He relates that the day prior to his visit to [facility] he had about 6 beers that night he said started having abdominal pain that woke him up in the middle of the night, which progressively got worst with nausea and vomiting  Hospital Course:  Alcoholic pancreatitis Active Problems:  Alcohol abuse Started on a PCA pump, after 3 days of IV narcotics and conservative manegement with nothing by mouth. His pain improved, started on a clear liq diet and oral home dose of narcotics. He tolerate regular diet and d/c home.   Procedures:  CT abd and pelvis  Consultations:  None  Discharge Exam: Filed Vitals:   04/17/15 0535  BP: 119/63  Pulse: 78  Temp: 98.4 F (36.9 C)  Resp: 18    General: A&O x3 Cardiovascular: RRR Respiratory: good air movement CTA B/L  Discharge Instructions   Discharge Instructions    Diet - low sodium heart healthy    Complete by:  As directed      Increase activity slowly    Complete by:  As directed           Current Discharge Medication List    CONTINUE these medications which have CHANGED   Details  Oxycodone HCl 10 MG TABS Take 2 tablets (20 mg total) by mouth every 4 (four) hours as needed (10mg  for mild pain, 15mg  for moderate pain, 20mg  for severe pain). Qty: 30 tablet, Refills: 0      CONTINUE these medications  which have NOT CHANGED   Details  esomeprazole (NEXIUM) 20 MG capsule Take 20 mg by mouth daily as needed (acid reflux).    senna (SENOKOT) 8.6 MG tablet Take 2 tablets by mouth daily as needed for constipation.       Allergies  Allergen Reactions  . Codeine Nausea Only      The results of significant diagnostics from this hospitalization (including imaging, microbiology, ancillary and laboratory) are listed below for reference.    Significant Diagnostic Studies: [place] Abdomen Complete  04/13/2015  CLINICAL DATA:  Severe epigastric pain with nausea and vomiting. History of ethanol abuse and pancreatitis EXAM: ULTRASOUND ABDOMEN COMPLETE COMPARISON:  Abdominal CT 07/31/2014 FINDINGS: Gallbladder: Distended gallbladder without inflammatory features, focal tenderness, or stone. Common bile duct: Diameter: 4 mm Liver: No focal lesion identified. Within normal limits in parenchymal echogenicity. Antegrade flow in the imaged portal venous system. IVC: Negative grayscale appearance. Pancreas: Limited visualization with shadowing over the head. No evidence of fluid collection or ductal enlargement. Spleen: Size and appearance within normal limits. Right Kidney: Length: 10.5 cm. Echogenicity within normal limits. No mass or hydronephrosis visualized. Left Kidney: Length: 10.4 cm. Echogenicity within normal limits. No mass or hydronephrosis visualized. Abdominal aorta: No aneurysm visualized. IMPRESSION: Negative abdominal ultrasound. Electronically Signed   By: Marnee SpringJonathon  Watts M.D.   On: 04/13/2015 11:05    Microbiology: No results found for  this or any previous visit (from the past 240 hour(s)).   Labs: Basic Metabolic Panel:  Recent Labs Lab 04/13/15 0740 04/13/15 1912 04/14/15 0520 04/16/15 1201  NA 138 137 140 138  K 3.6 3.9 4.7 3.0*  CL 106 105 104 102  CO2 GLUCOSE 103* 120* 100* 131*  BUN <5*  CREATININE 0.80 0.75 0.97 0.74  CALCIUM 9.3 8.8* 8.9 8.5*   Liver  Function Tests:  Recent Labs Lab 04/13/15 0740  AST 33  ALT 16*  ALKPHOS 101  BILITOT 2.4*  PROT 7.4  ALBUMIN 4.7    Recent Labs Lab 04/13/15 0740  LIPASE 134*   No results for input(s): AMMONIA in the last 168 hours. CBC:  Recent Labs Lab 04/13/15 0740 04/13/15 1912 04/14/15 0520  WBC 9.5 11.2* 10.7*  NEUTROABS 6.7  --   --   HGB 14.3 14.0 14.5  HCT 40.8 40.2 42.8  MCV 89.9 91.2 93.9  PLT 299 257 235   Cardiac Enzymes: No results for input(s): CKTOTAL, CKMB, CKMBINDEX, TROPONINI in the last 168 hours. BNP: BNP (last 3 results) No results for input(s): BNP in the last 8760 hours.  ProBNP (last 3 results) No results for input(s): PROBNP in the last 8760 hours.  CBG: No results for input(s): GLUCAP in the last 168 hours.     Signed:  Marinda Elk  [facility] 04/17/2015, 12:31 PM

## 2015-04-24 ENCOUNTER — Other Ambulatory Visit: Payer: Self-pay | Admitting: Family Medicine

## 2015-04-24 DIAGNOSIS — R109 Unspecified abdominal pain: Secondary | ICD-10-CM

## 2015-04-24 DIAGNOSIS — K859 Acute pancreatitis without necrosis or infection, unspecified: Secondary | ICD-10-CM

## 2015-05-01 ENCOUNTER — Ambulatory Visit
Admission: RE | Admit: 2015-05-01 | Discharge: 2015-05-01 | Disposition: A | Discharge status: Home / Self Care | Payer: BLUE CROSS/BLUE SHIELD | Source: Ambulatory Visit | Attending: Family Medicine | Admitting: Family Medicine

## 2015-05-01 DIAGNOSIS — R109 Unspecified abdominal pain: Secondary | ICD-10-CM

## 2015-05-01 DIAGNOSIS — K859 Acute pancreatitis without necrosis or infection, unspecified: Secondary | ICD-10-CM

## 2015-05-01 MED ORDER — IOPAMIDOL (ISOVUE-300) INJECTION 61%
100.0000 mL | Freq: Once | INTRAVENOUS | Status: AC | PRN
  Administered 2015-05-01: 100 mL via INTRAVENOUS

## 2015-10-17 ENCOUNTER — Encounter (HOSPITAL_COMMUNITY): Payer: Self-pay | Admitting: Emergency Medicine

## 2015-10-17 ENCOUNTER — Emergency Department (HOSPITAL_COMMUNITY): Payer: BLUE CROSS/BLUE SHIELD

## 2015-10-17 ENCOUNTER — Emergency Department (HOSPITAL_COMMUNITY)
Admission: EM | Admit: 2015-10-17 | Discharge: 2015-10-17 | Disposition: A | Discharge status: Home / Self Care | Payer: BLUE CROSS/BLUE SHIELD | Attending: Emergency Medicine | Admitting: Emergency Medicine

## 2015-10-17 DIAGNOSIS — F17210 Nicotine dependence, cigarettes, uncomplicated: Secondary | ICD-10-CM | POA: Insufficient documentation

## 2015-10-17 DIAGNOSIS — H532 Diplopia: Secondary | ICD-10-CM | POA: Diagnosis not present

## 2015-10-17 DIAGNOSIS — H578 Other specified disorders of eye and adnexa: Secondary | ICD-10-CM | POA: Diagnosis present

## 2015-10-17 DIAGNOSIS — E876 Hypokalemia: Secondary | ICD-10-CM | POA: Diagnosis not present

## 2015-10-17 DIAGNOSIS — Z8719 Personal history of other diseases of the digestive system: Secondary | ICD-10-CM | POA: Insufficient documentation

## 2015-10-17 LAB — BASIC METABOLIC PANEL
Anion gap: 11 (ref 5–15)
BUN: 8 mg/dL (ref 6–20)
CO2: 26 mmol/L (ref 22–32)
Calcium: 9.3 mg/dL (ref 8.9–10.3)
Chloride: 106 mmol/L (ref 101–111)
Creatinine, Ser: 0.78 mg/dL (ref 0.61–1.24)
GFR calc Af Amer: >60 mL/min (ref >60)
GLUCOSE: 100 mg/dL — AB (ref 65–99)
POTASSIUM: 2.9 mmol/L — AB (ref 3.5–5.1)
Sodium: 143 mmol/L (ref 135–145)

## 2015-10-17 LAB — CBC WITH DIFFERENTIAL/PLATELET
BASOS ABS: 0.1 10*3/uL (ref 0.0–0.1)
Basophils Relative: 1 %
Eosinophils Absolute: 0.1 10*3/uL (ref 0.0–0.7)
Eosinophils Relative: 1 %
HEMATOCRIT: 40.9 % (ref 39.0–52.0)
Hemoglobin: 13.8 g/dL (ref 13.0–17.0)
LYMPHS PCT: 19 %
Lymphs Abs: 1.6 10*3/uL (ref 0.7–4.0)
MCH: 30.3 pg (ref 26.0–34.0)
MCHC: 33.7 g/dL (ref 30.0–36.0)
MCV: 89.9 fL (ref 78.0–100.0)
Monocytes Absolute: 0.5 10*3/uL (ref 0.1–1.0)
Monocytes Relative: 6 %
NEUTROS ABS: 6.2 10*3/uL (ref 1.7–7.7)
NEUTROS PCT: 73 %
Platelets: 328 10*3/uL (ref 150–400)
RBC: 4.55 MIL/uL (ref 4.22–5.81)
RDW: 12.8 % (ref 11.5–15.5)
WBC: 8.4 10*3/uL (ref 4.0–10.5)

## 2015-10-17 MED ORDER — POTASSIUM CHLORIDE 10 MEQ/100ML IV SOLN
10.0000 meq | Freq: Once | INTRAVENOUS | Status: AC
  Administered 2015-10-17: 10 meq via INTRAVENOUS
  Filled 2015-10-17: qty 100

## 2015-10-17 MED ORDER — GADOBENATE DIMEGLUMINE 529 MG/ML IV SOLN
15.0000 mL | Freq: Once | INTRAVENOUS | Status: AC | PRN
  Administered 2015-10-17: 12 mL via INTRAVENOUS

## 2015-10-17 MED ORDER — POTASSIUM CHLORIDE CRYS ER 20 MEQ PO TBCR
60.0000 meq | EXTENDED_RELEASE_TABLET | Freq: Once | ORAL | Status: AC
  Administered 2015-10-17: 60 meq via ORAL
  Filled 2015-10-17: qty 3

## 2015-10-17 MED ORDER — POTASSIUM CHLORIDE ER 20 MEQ PO TBCR: 40.0000 meq | EXTENDED_RELEASE_TABLET | Freq: Every day | ORAL | Status: AC

## 2015-10-17 NOTE — ED Provider Notes (Signed)
CSN: [id number]     Arrival date & time 10/17/15  1611 History   First MD Initiated Contact with Patient 10/17/15 1716     Chief Complaint  Patient presents with  . Eye Problem    HPI   Darren Parrish is an 35 y.o. male with history of EtOH abuse who presents to the ED for evaluation of diplopia. He was sent to the ED by his ophthalmologist, Dr. Sherryll Burger. He was originally seen by Dr. Sherryll Burger one week ago for evaluation of diplopia, blurred vision, and left eye esotropia. Dr. Sherryll Burger initially believed his symptoms to be related to his clonidine patch. Pt states that he stopped using the clonidine patch last week and his esotropia has improved though he continues to report double vision and blurry vision. He states he notes the symptoms only when using both eyes but when he closes one eye his vision returns to normal. He states he went to see Dr. Sherryll Burger for follow up today who sent him to the ED for MRI brain and orbits, with and without contrast, for further evaluation. Pt otherwise denies new symptoms. Denies new numbness, weakness, tingling, headache, eye pain. Denies fever, chills. He states that at baseline he has perfect vision and has never required contacts or classes.  Past Medical History  Diagnosis Date  . Pancreatitis   . Chronic alcohol abuse    Past Surgical History  Procedure Laterality Date  .  thumb surgery Right   . Wisdom tooth extraction Bilateral    Family History  Problem Relation Age of Onset  . Lung cancer Mother   . Kidney failure Father    Social History  Substance Use Topics  . Smoking status: Current Some Day Smoker -- 1.00 packs/day    Types: E-cigarettes  . Smokeless tobacco: Current User  . Alcohol Use: 3.6 oz/week    6 Cans of beer per week     Comment: chronic alcohol abuse    Review of Systems  All other systems reviewed and are negative.     Allergies  Codeine  Home Medications   Prior to Admission medications   Medication Sig Start Date End Date  Taking? Authorizing Provider  esomeprazole (NEXIUM) 20 MG capsule Take 20 mg by mouth daily as needed (acid reflux).    Historical Provider, MD  Oxycodone HCl 10 MG TABS Take 2 tablets (20 mg total) by mouth every 4 (four) hours as needed (10mg  for mild pain, 15mg  for moderate pain, 20mg  for severe pain). 04/17/15   Marinda Elk, MD   BP 156/94 mmHg  Pulse 84  Temp(Src) 98.8 F (37.1 C) (Oral)  Resp 18  SpO2 100% Physical Exam  Constitutional: He is oriented to person, place, and time. No distress.  HENT:  Head: Atraumatic.  Right Ear: External ear normal.  Left Ear: External ear normal.  Nose: Nose normal.  Eyes: Conjunctivae and EOM are normal. Pupils are equal, round, and reactive to light. No scleral icterus. Right eye exhibits no nystagmus. Left eye exhibits no nystagmus.  Neck: Normal range of motion. Neck supple.  Cardiovascular: Normal rate and regular rhythm.   Pulmonary/Chest: Effort normal. No respiratory distress. He exhibits no tenderness.  Abdominal: Soft. He exhibits no distension. There is no tenderness.  Neurological: He is alert and oriented to person, place, and time. He has normal strength and normal reflexes. No cranial nerve deficit or sensory deficit. Coordination normal.  Normal finger to nose No pronator drift  Skin: Skin is  warm and dry. He is not diaphoretic.  Psychiatric: He has a normal mood and affect. His behavior is normal.  Nursing note and vitals reviewed.   ED Course  Procedures (including critical care time) Labs Review Labs Reviewed  BASIC METABOLIC PANEL - Abnormal; Notable for the following:    Potassium 2.9 (*)    Glucose, Bld 100 (*)    All other components within normal limits  CBC WITH DIFFERENTIAL/PLATELET    Imaging Review Mr Laqueta Jean Wo Contrast  10/17/2015  CLINICAL DATA:  Blurred vision and left eye esotropia beginning 1 week ago. EXAM: MRI HEAD AND ORBITS WITHOUT AND WITH CONTRAST TECHNIQUE: Multiplanar, multiecho pulse  sequences of the brain and surrounding structures were obtained without and with intravenous contrast. Multiplanar, multiecho pulse sequences of the orbits and surrounding structures were obtained including fat saturation techniques, before and after intravenous contrast administration. CONTRAST:  12mL MULTIHANCE GADOBENATE DIMEGLUMINE 529 MG/ML IV SOLN COMPARISON:  None. FINDINGS: MRI HEAD FINDINGS There is no evidence of acute infarct, intracranial hemorrhage, mass, midline shift, or extra-axial fluid collection. Ventricles and sulci are normal. The brain is normal in signal. 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 No abnormal enhancement is identified elsewhere. The left maxillary sinus is small with mild mucosal thickening. Mastoid air cells are clear. Major intracranial vascular flow voids are preserved. MRI ORBITS FINDINGS The globes appear intact. The optic nerves are symmetric and normal in appearance, as are the extraocular muscles. No orbital mass or inflammatory change is seen. The optic chiasm is unremarkable. No cavernous sinus mass is evident. The pituitary is unremarkable. Lacrimal glands are unremarkable. IMPRESSION: 1. No acute intracranial abnormality. 2. Unremarkable appearance of the brain aside from an incidental cerebellar developmental venous anomaly. 3. Unremarkable appearance of the orbits. Electronically Signed   By: Sebastian Ache M.D.   On: 10/17/2015 19:48   Mr Birdie Hopes Wo/w Cm  10/17/2015  CLINICAL DATA:  Blurred vision and left eye esotropia beginning 1 week ago. EXAM: MRI HEAD AND ORBITS WITHOUT AND WITH CONTRAST TECHNIQUE: Multiplanar, multiecho pulse sequences of the brain and surrounding structures were obtained without and with intravenous contrast. Multiplanar, multiecho pulse sequences of the orbits and surrounding structures were obtained including fat saturation techniques, before and after intravenous contrast administration. CONTRAST:  12mL  MULTIHANCE GADOBENATE DIMEGLUMINE 529 MG/ML IV SOLN COMPARISON:  None. FINDINGS: MRI HEAD FINDINGS There is no evidence of acute infarct, intracranial hemorrhage, mass, midline shift, or extra-axial fluid collection. Ventricles and sulci are normal. The brain is normal in signal. 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 No abnormal enhancement is identified elsewhere. The left maxillary sinus is small with mild mucosal thickening. Mastoid air cells are clear. Major intracranial vascular flow voids are preserved. MRI ORBITS FINDINGS The globes appear intact. The optic nerves are symmetric and normal in appearance, as are the extraocular muscles. No orbital mass or inflammatory change is seen. The optic chiasm is unremarkable. No cavernous sinus mass is evident. The pituitary is unremarkable. Lacrimal glands are unremarkable. IMPRESSION: 1. No acute intracranial abnormality. 2. Unremarkable appearance of the brain aside from an incidental cerebellar developmental venous anomaly. 3. Unremarkable appearance of the orbits. Electronically Signed   By: Sebastian Ache M.D.   On: 10/17/2015 19:48   I have personally reviewed and evaluated these images and lab results as part of my medical decision-making.   EKG Interpretation None      MDM   Final diagnoses:  Diplopia  Hypokalemia    Labs reveal hypokalemia of 2.9 but otherwise unremarkable. Repletion started in the ED. MRI brain/orbits with no acute abnormalities found. Pt remains neurologically intact. Will send home with short course of k-dur. Instructed to f/u with ophthalmology. Referral also given to neurology prn. ER return precautions given.     Carlene CoriaSerena Y Maanav Kassabian, PA-C 10/17/15 2034  Richardean Canalavid H Yao, MD 10/19/15 [phone]

## 2015-10-17 NOTE — ED Notes (Signed)
Pt states he was sent here by his ophthalmologist for an MRI. States that he woke up 1 week ago and his L eye was turning inward and he had blurred vision. States that his eye is now more aligned but he still has blurred vision. Alert and oriented.

## 2015-10-17 NOTE — Discharge Instructions (Signed)
Please call Dr. Sherryll BurgerShah to schedule a follow up appointment. Your MRI scans were normal today. Your potassium was a bit low. I will give you a few days of supplements at home. Return to the ER for new or worsening symptoms.   Diplopia Diplopia is the condition of having double vision or seeing two of a single object. There are many causes of diplopia. Some are not dangerous and can be easily corrected. Diplopia may also be a symptom of a serious medical problem. There are two types of diplopia.  Monocular diplopia. This is double vision that affects only one eye. Monocular diplopia is often caused by a clouding of the lens in your eye (cataract) or by disruptions in the way that your eye focuses light.  Binocular diplopia. This is double vision that affects both eyes. However, when you shut one eye, the double vision will go away. Binocular diplopia may be more serious. It can be caused by:  Problems with the nerves or muscles that are responsible for eye movement.  Neurologic diseases.  Thyroid problems.  Tumors.  An infection near your eyes.  A stroke. You may need to see a health care provider who specializes in eye conditions (ophthalmologist) or a nerve specialist (neurologist) to find the cause. HOME CARE INSTRUCTIONS  Tell your health care provider about any changes in your vision.  Do not drive or operate heavy machinery if diplopia interferes with your vision.  Keep all follow-up visits as directed by your health care provider. This is important. SEEK MEDICAL CARE IF:  Your diplopia gets worse.  You develop any other symptoms along with your diplopia, such as:  Weakness.  Numbness.  Headache.  Eye pain.  Clumsiness.  Nausea.  Drooping eyelids.  Abnormal movement of one of your eyes. SEEK IMMEDIATE MEDICAL CARE IF:  You have sudden vision loss.  You suddenly get a very bad headache.  You have sudden weakness or numbness.  You suddenly lose the ability  to speak, understand speech, or both.   This information is not intended to replace advice given to you by your health care provider. Make sure you discuss any questions you have with your health care provider.   Document Released: 04/04/2004 Document Revised: 10/18/2014 Document Reviewed: 04/27/2014 Elsevier Interactive Patient Education 2016 [employer]  Hypokalemia Hypokalemia means that the amount of potassium in the blood is lower than normal.Potassium is a chemical, called an electrolyte, that helps regulate the amount of fluid in the body. It also stimulates muscle contraction and helps nerves function properly.Most of the body's potassium is inside of cells, and only a very small amount is in the blood. Because the amount in the blood is so small, minor changes can be life-threatening. CAUSES  Antibiotics.  Diarrhea or vomiting.  Using laxatives too much, which can cause diarrhea.  Chronic kidney disease.  Water pills (diuretics).  Eating disorders (bulimia).  Low magnesium level.  Sweating a lot. SIGNS AND SYMPTOMS  Weakness.  Constipation.  Fatigue.  Muscle cramps.  Mental confusion.  Skipped heartbeats or irregular heartbeat (palpitations).  Tingling or numbness. DIAGNOSIS  Your health care provider can diagnose hypokalemia with blood tests. In addition to checking your potassium level, your health care provider may also check other lab tests. TREATMENT Hypokalemia can be treated with potassium supplements taken by mouth or adjustments in your current medicines. If your potassium level is very low, you may need to get potassium through a vein (IV) and be monitored in the hospital.  A diet high in potassium is also helpful. Foods high in potassium are:  Nuts, such as peanuts and pistachios.  Seeds, such as sunflower seeds and pumpkin seeds.  Peas, lentils, and lima beans.  Whole grain and bran cereals and breads.  Fresh fruit and vegetables, such as  apricots, avocado, bananas, cantaloupe, kiwi, oranges, tomatoes, asparagus, and potatoes.  Orange and tomato juices.  Red meats.  Fruit yogurt. HOME CARE INSTRUCTIONS  Take all medicines as prescribed by your health care provider.  Maintain a healthy diet by including nutritious food, such as fruits, vegetables, nuts, whole grains, and lean meats.  If you are taking a laxative, be sure to follow the directions on the label. SEEK MEDICAL CARE IF:  Your weakness gets worse.  You feel your heart pounding or racing.  You are vomiting or having diarrhea.  You are diabetic and having trouble keeping your blood glucose in the normal range. SEEK IMMEDIATE MEDICAL CARE IF:  You have chest pain, shortness of breath, or dizziness.  You are vomiting or having diarrhea for more than 2 days.  You faint. MAKE SURE YOU:   Understand these instructions.  Will watch your condition.  Will get help right away if you are not doing well or get worse.   This information is not intended to replace advice given to you by your health care provider. Make sure you discuss any questions you have with your health care provider.   Document Released: 06/03/2005 Document Revised: 06/24/2014 Document Reviewed: 12/04/2012 Elsevier Interactive Patient Education [employer]

## 2015-10-17 NOTE — ED Notes (Signed)
Transported to MRI

## 2016-03-03 IMAGING — CT CT ABD-PELV W/ CM
2 of 4 series · 16 of 46 positions shown, 18 images · IV contrast (omnipaque)
Comparison: None.

CLINICAL DATA: 33-year-old male with acute fall and left abdominal
and pelvic pain. Initial encounter.

EXAM:
CT ABDOMEN AND PELVIS WITH CONTRAST
TECHNIQUE: Multidetector CT imaging of the abdomen and pelvis was performed
using the standard protocol following bolus administration of
intravenous contrast.
CONTRAST:  100mL OMNIPAQUE IOHEXOL 300 MG/ML  SOLN

[Series 2: abd/pelvis 5.0 b31f · axial · 0.67mm/px · z∈[-496,-56]mm · 13 of 96 slices shown, 15 images]
[im 4/96  soft-tissue]
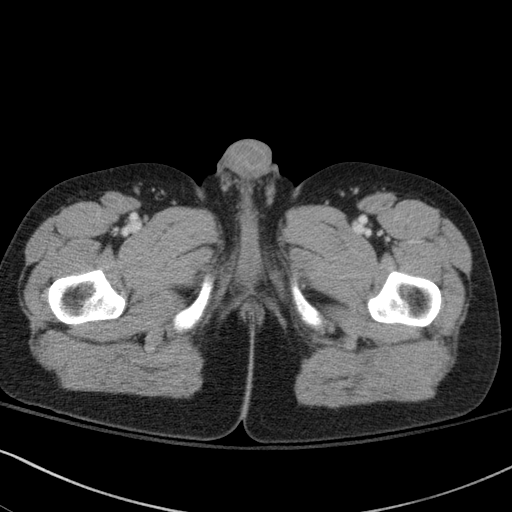
[im 4/96  bone]
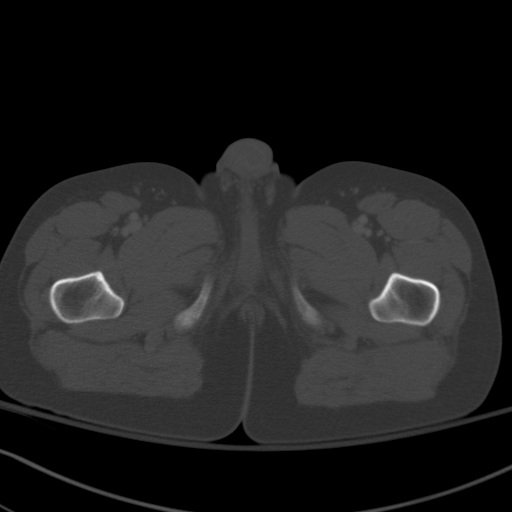
[im 12/96  soft-tissue]
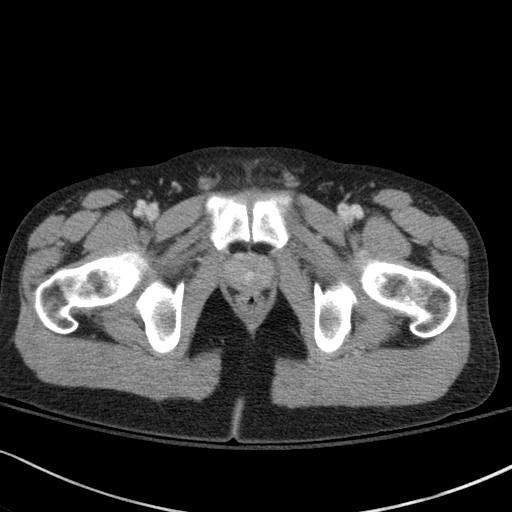
[im 20/96  soft-tissue]
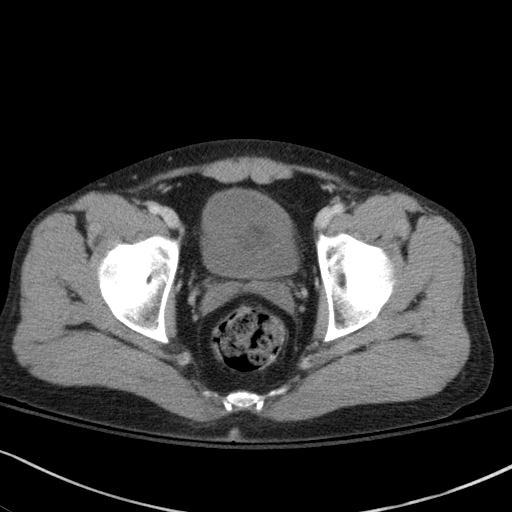
[im 28/96  soft-tissue]
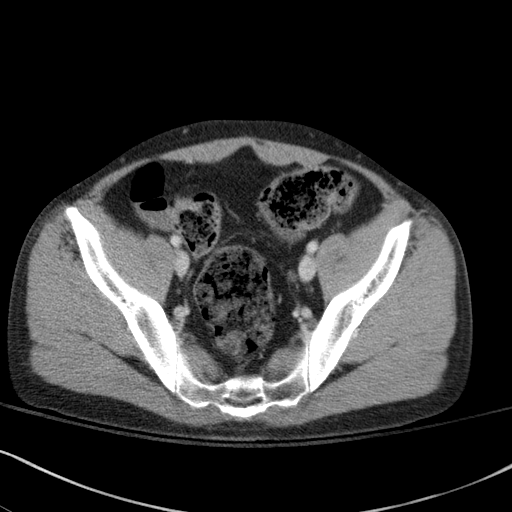
[im 32/96  soft-tissue]
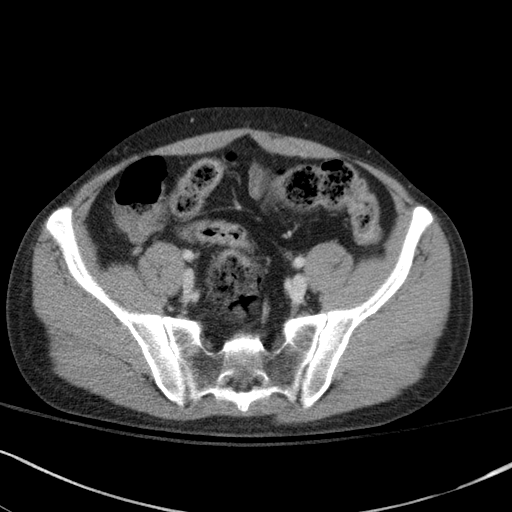
[im 40/96  soft-tissue]
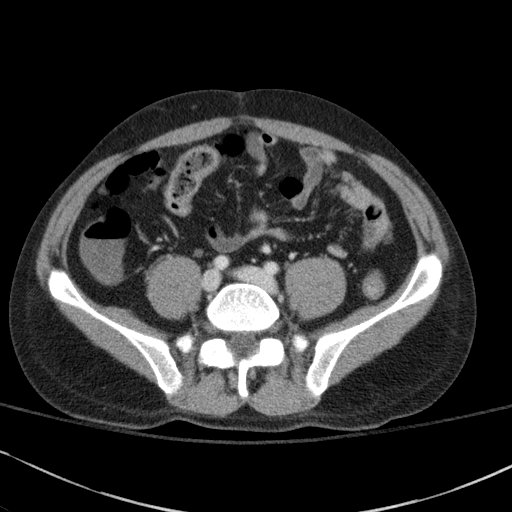
[im 48/96  soft-tissue]
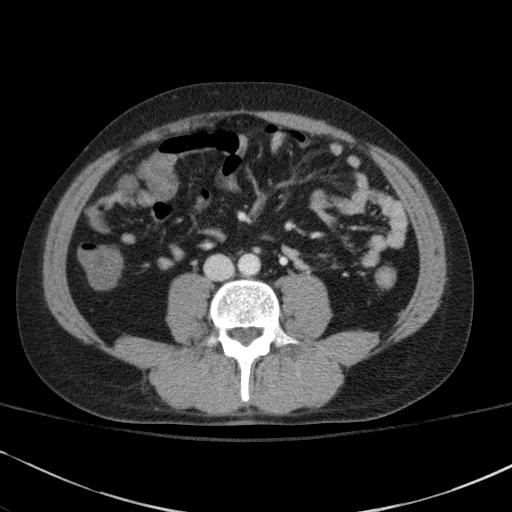
[im 56/96  soft-tissue]
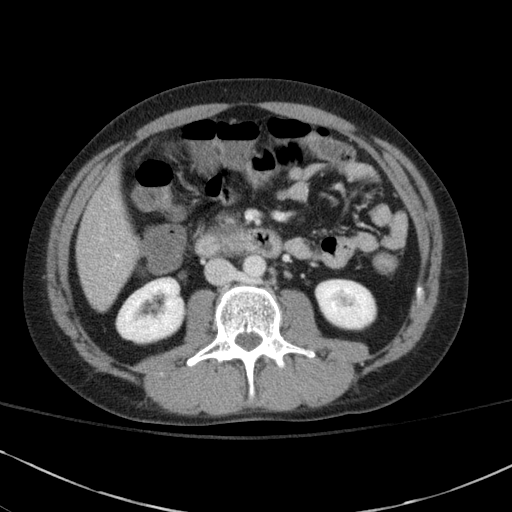
[im 64/96  soft-tissue]
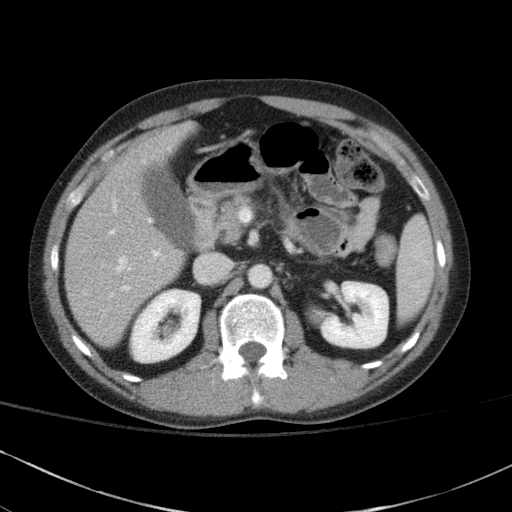
[im 64/96  bone]
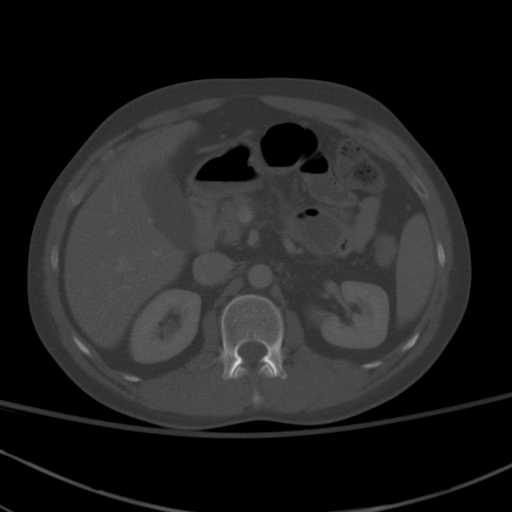
[im 68/96  soft-tissue]
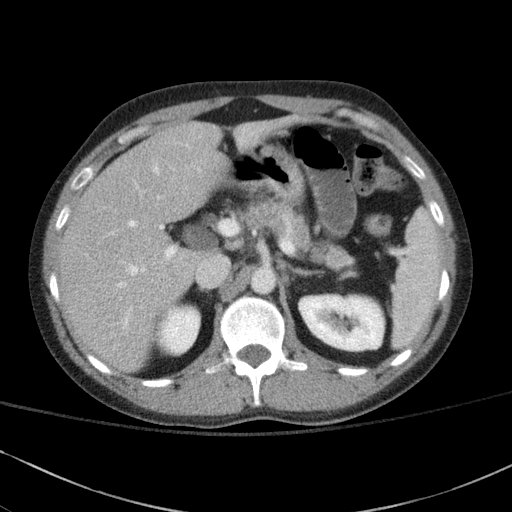
[im 76/96  soft-tissue]
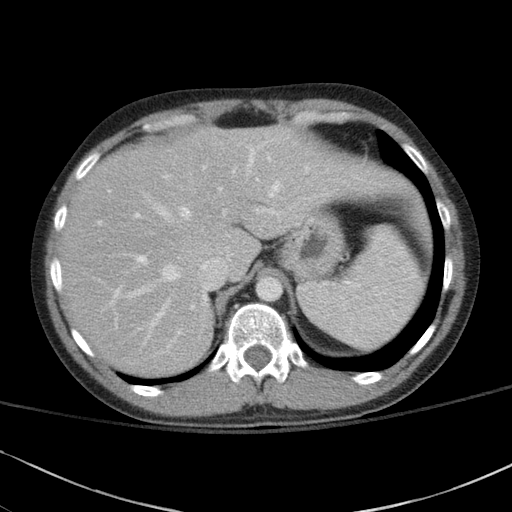
[im 84/96  soft-tissue]
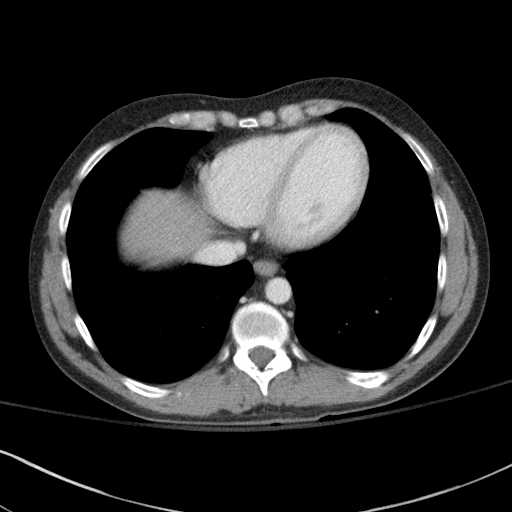
[im 92/96  soft-tissue]
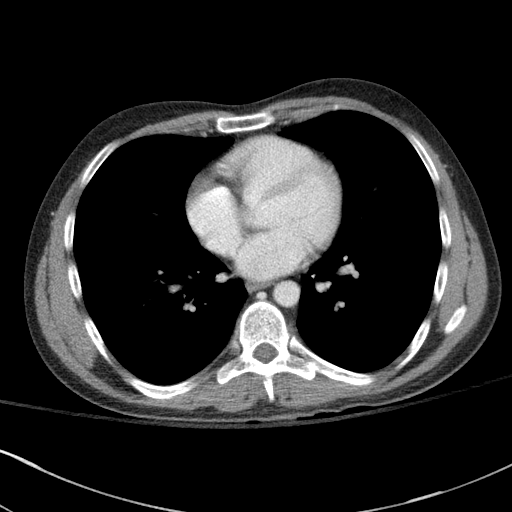

[Series 5: abd/pelvis 3.0 coronal · coronal · 0.78mm/px · 3 of 73 slices shown]
[im 25/73  soft-tissue]
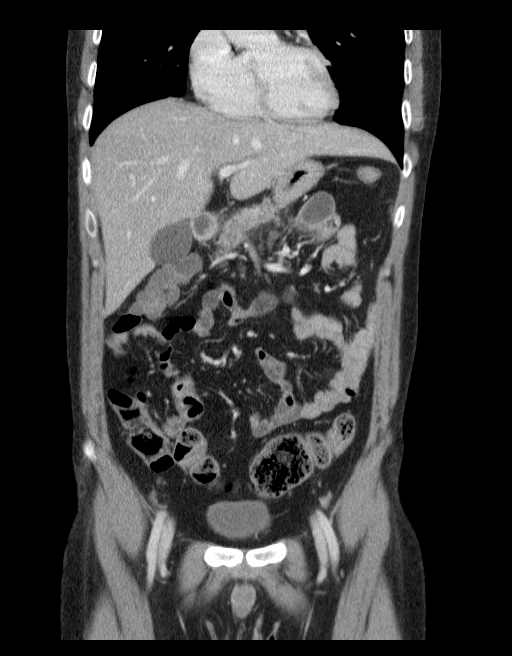
[im 33/73  soft-tissue]
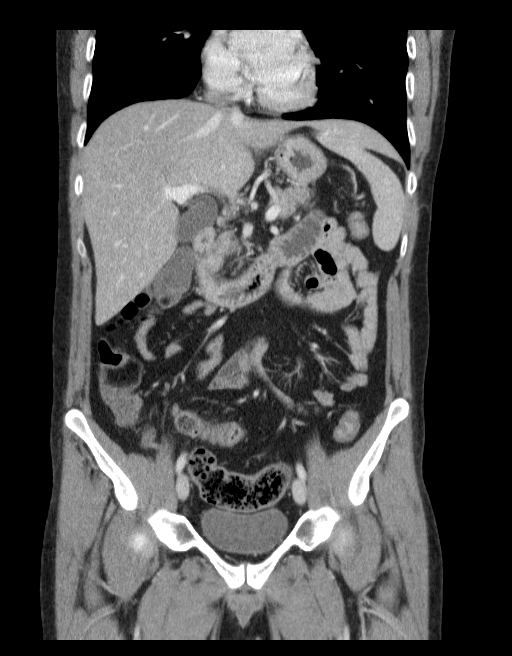
[im 41/73  soft-tissue]
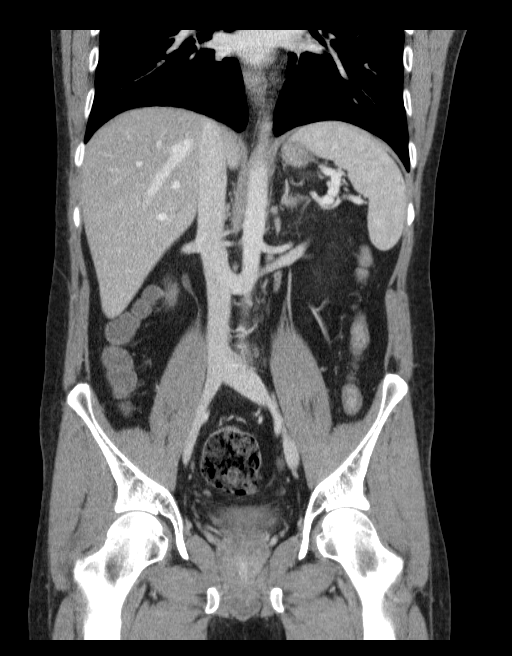

[16 of 46 positions shown; findings below may reference images not displayed]

FINDINGS: The lung bases are unremarkable.

A tiny cyst in the inferior right liver is identified. The liver is
otherwise unremarkable.

The spleen, gallbladder, kidneys, adrenal glands and bladder are
unremarkable.

A small amount of fluid/inflammation adjacent to the pancreatic tail
and a mildly distended loop of small bowel (images 30-36) is
nonspecific and could represent occult small bowel or venous injury.
Occult pancreatic injury and mild pancreatitis is considered
somewhat less likely as there appears be a fat plane between the
pancreas and the fluid/inflammation. The pancreas appears
unremarkable. There is no evidence of small bowel wall thickening,
pneumoperitoneum or bowel obstruction.

No enlarged lymph nodes, biliary dilatation or abdominal aortic
aneurysm identified.

No acute or suspicious bony abnormalities are noted.
IMPRESSION: Small amount of fluid/ inflammation adjacent to the pancreatic tail
and a mildly distended small bowel loop. This could represent occult
small bowel injury or small venous injury. Occult pancreatic injury
and mild pancreatitis is considered slightly less likely as there
appears to be a fat plane between the pancreas and
fluid/inflammation. No evidence of pneumoperitoneum or bowel wall
thickening.

No other significant abnormalities identified.

## 2016-03-04 IMAGING — US US ABDOMEN LIMITED
1 series · 14 of 25 positions shown · non-contrast
Comparison: None.

CLINICAL DATA: Pancreatitis

EXAM:
US ABDOMEN LIMITED - RIGHT UPPER QUADRANT

[Series 1: us abdomen limited · 0.27mm/px · 14 of 28 slices shown]
[im 1/28]
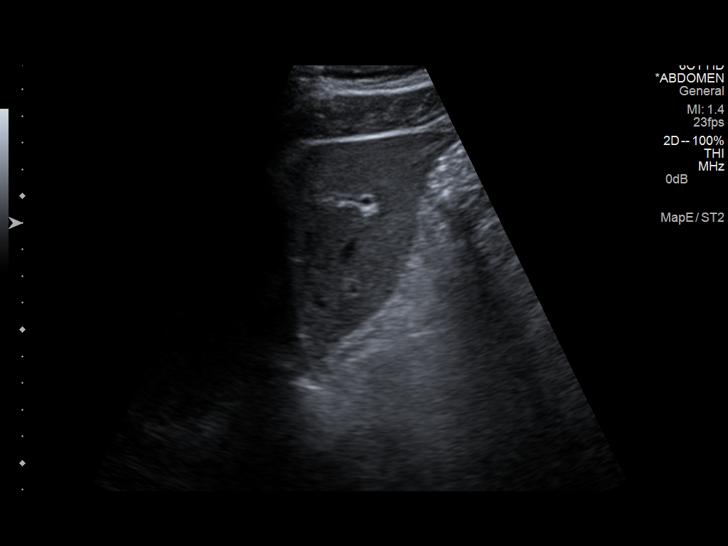
[im 3/28]
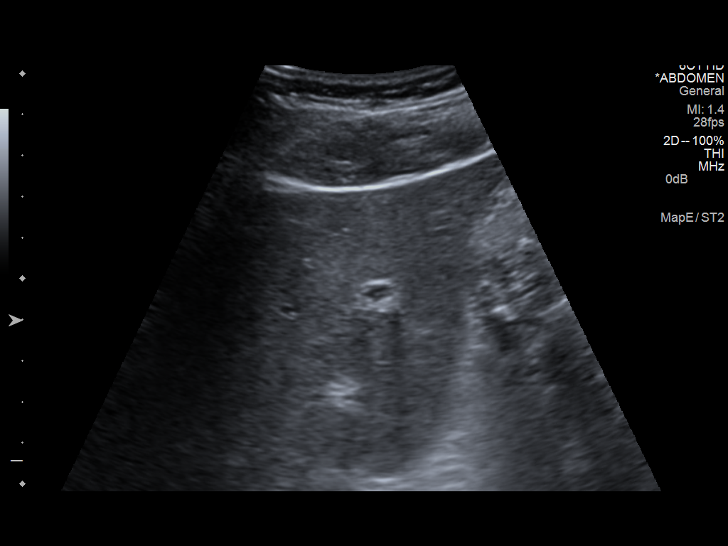
[im 5/28]
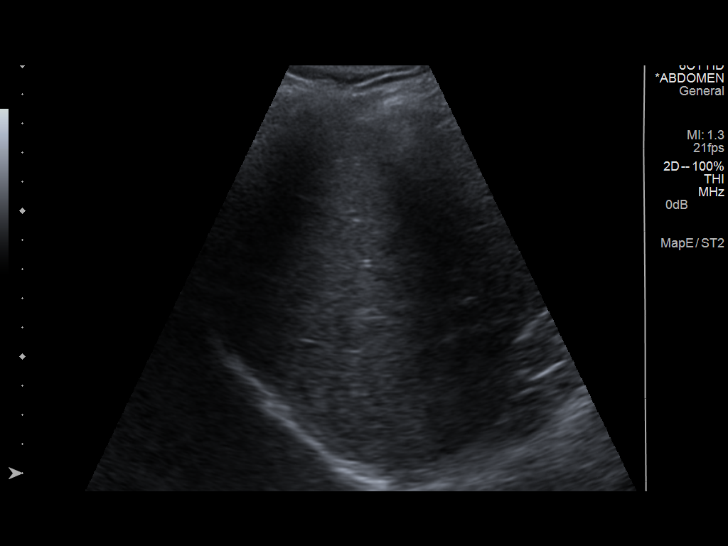
[im 7/28]
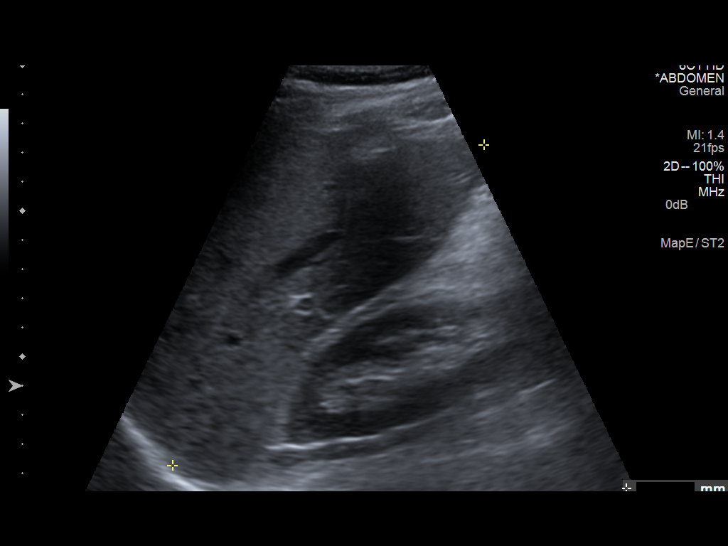
[im 10/28]
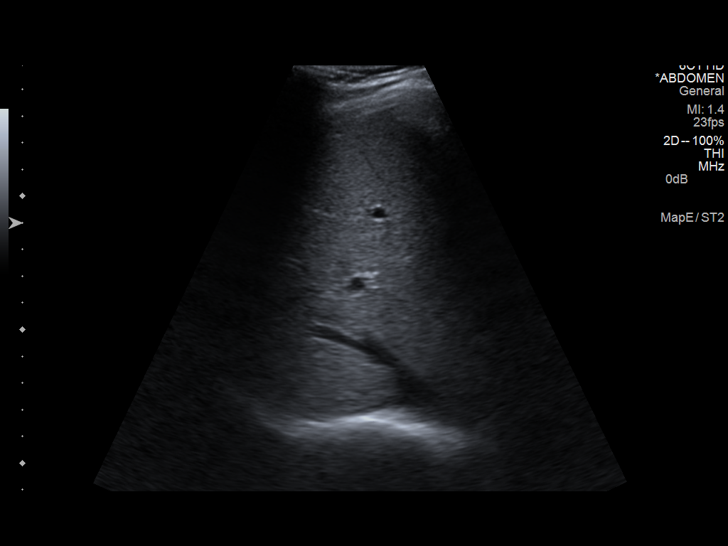
[im 11/28]
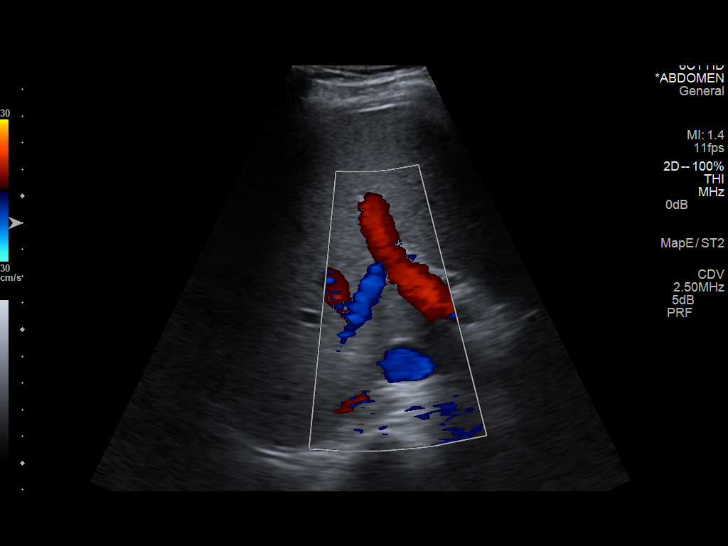
[im 13/28]
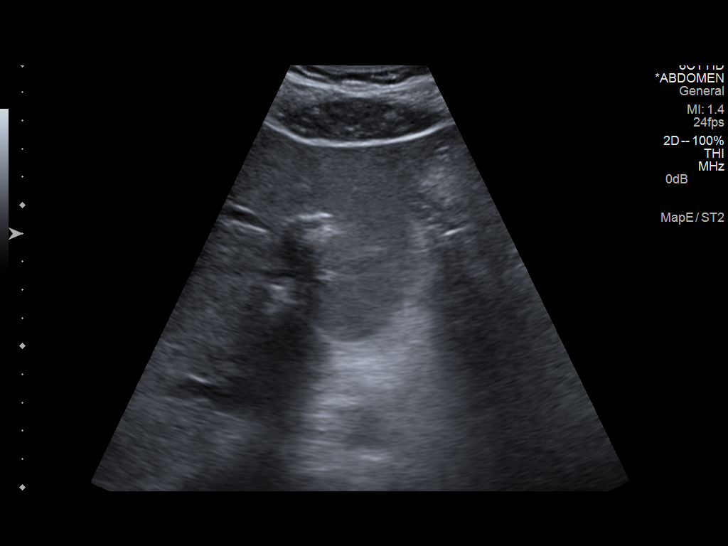
[im 15/28]
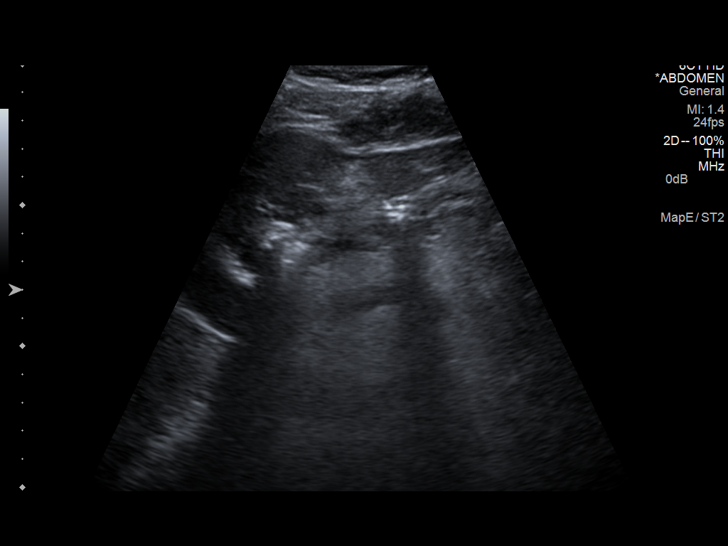
[im 17/28]
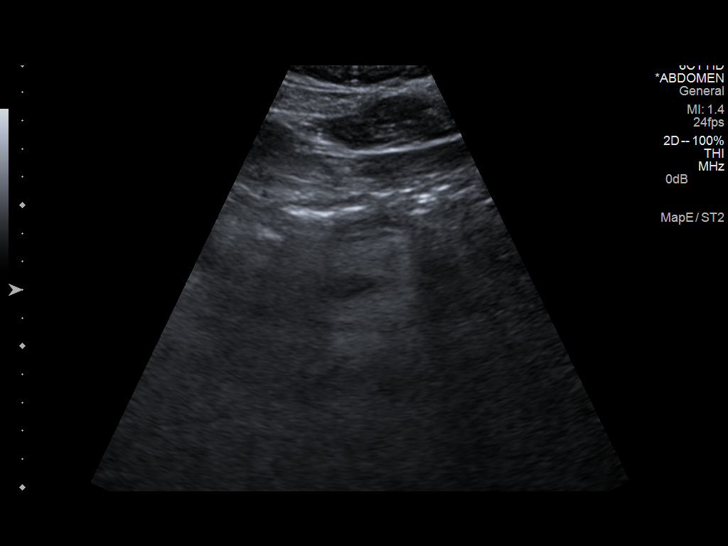
[im 19/28]
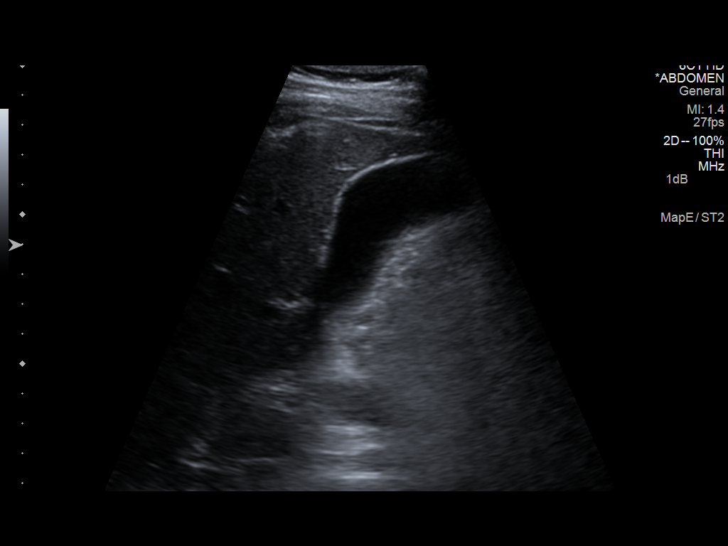
[im 21/28]
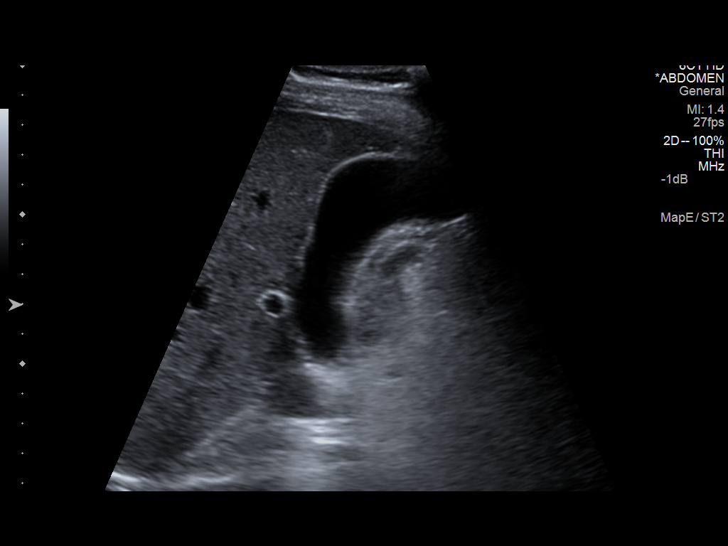
[im 23/28]
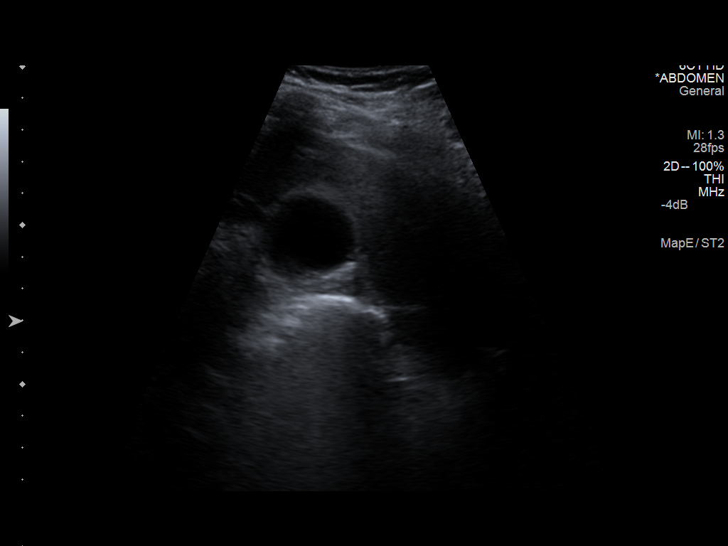
[im 25/28]
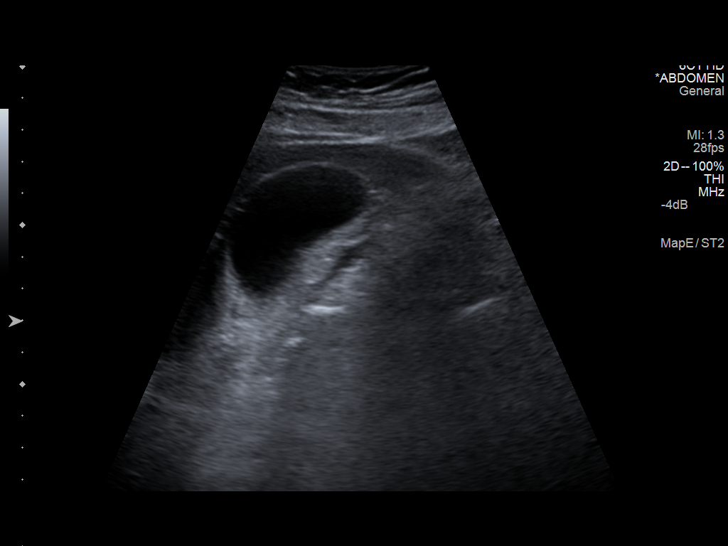
[im 28/28]
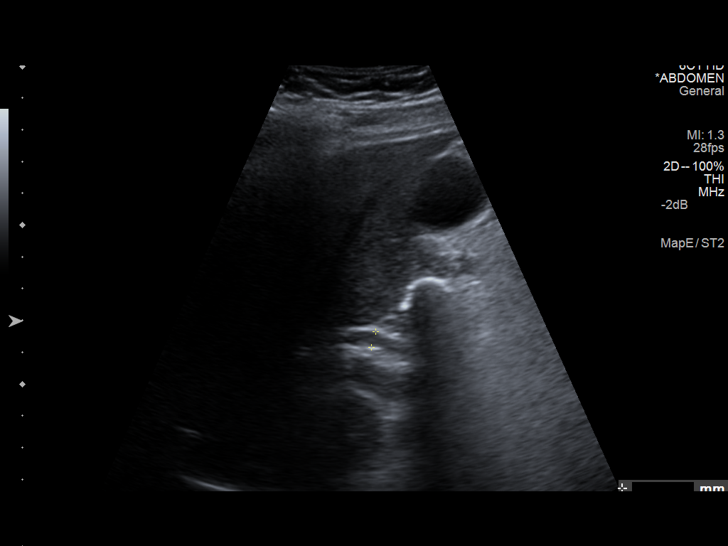

[14 of 25 positions shown; findings below may reference images not displayed]

FINDINGS: Gallbladder:

Well distended with dependent echogenic material within consistent
with sludge. No definitive calculi are noted.

Common bile duct:

Diameter: 5.1 mm.

Liver:

No focal lesion identified. Within normal limits in parenchymal
echogenicity.

No ascites is identified
IMPRESSION: Gallbladder sludge.  No other focal abnormality is noted.

## 2016-06-08 ENCOUNTER — Ambulatory Visit (HOSPITAL_COMMUNITY)
Admission: RE | Admit: 2016-06-08 | Discharge: 2016-06-08 | Disposition: A | Discharge status: Home / Self Care | Payer: BLUE CROSS/BLUE SHIELD | Attending: Psychiatry | Admitting: Psychiatry

## 2016-06-08 ENCOUNTER — Encounter (HOSPITAL_COMMUNITY): Payer: Self-pay | Admitting: Emergency Medicine

## 2016-06-08 ENCOUNTER — Encounter (HOSPITAL_COMMUNITY): Payer: Self-pay | Admitting: Behavioral Health

## 2016-06-08 ENCOUNTER — Emergency Department (HOSPITAL_COMMUNITY)
Admission: EM | Admit: 2016-06-08 | Discharge: 2016-06-09 | Disposition: A | Discharge status: Home / Self Care | Payer: BLUE CROSS/BLUE SHIELD | Attending: Emergency Medicine | Admitting: Emergency Medicine

## 2016-06-08 DIAGNOSIS — F1994 Other psychoactive substance use, unspecified with psychoactive substance-induced mood disorder: Secondary | ICD-10-CM | POA: Diagnosis present

## 2016-06-08 DIAGNOSIS — F1110 Opioid abuse, uncomplicated: Secondary | ICD-10-CM | POA: Diagnosis present

## 2016-06-08 DIAGNOSIS — F332 Major depressive disorder, recurrent severe without psychotic features: Secondary | ICD-10-CM | POA: Diagnosis not present

## 2016-06-08 DIAGNOSIS — Z841 Family history of disorders of kidney and ureter: Secondary | ICD-10-CM | POA: Diagnosis not present

## 2016-06-08 DIAGNOSIS — Z79899 Other long term (current) drug therapy: Secondary | ICD-10-CM | POA: Insufficient documentation

## 2016-06-08 DIAGNOSIS — F17210 Nicotine dependence, cigarettes, uncomplicated: Secondary | ICD-10-CM | POA: Insufficient documentation

## 2016-06-08 DIAGNOSIS — F1920 Other psychoactive substance dependence, uncomplicated: Secondary | ICD-10-CM

## 2016-06-08 DIAGNOSIS — Z801 Family history of malignant neoplasm of trachea, bronchus and lung: Secondary | ICD-10-CM | POA: Diagnosis not present

## 2016-06-08 LAB — COMPREHENSIVE METABOLIC PANEL
ALT: 17 U/L (ref 17–63)
AST: 27 U/L (ref 15–41)
Albumin: 4.6 g/dL (ref 3.5–5.0)
Alkaline Phosphatase: 85 U/L (ref 38–126)
Anion gap: 9 (ref 5–15)
BILIRUBIN TOTAL: 1 mg/dL (ref 0.3–1.2)
BUN: 6 mg/dL (ref 6–20)
CALCIUM: 9.2 mg/dL (ref 8.9–10.3)
CHLORIDE: 106 mmol/L (ref 101–111)
CO2: 26 mmol/L (ref 22–32)
CREATININE: 0.88 mg/dL (ref 0.61–1.24)
Glucose, Bld: 118 mg/dL — ABNORMAL HIGH (ref 65–99)
Potassium: 3.8 mmol/L (ref 3.5–5.1)
Sodium: 141 mmol/L (ref 135–145)
TOTAL PROTEIN: 7.4 g/dL (ref 6.5–8.1)

## 2016-06-08 LAB — RAPID URINE DRUG SCREEN, HOSP PERFORMED
Amphetamines: NOT DETECTED
BENZODIAZEPINES: NOT DETECTED
Barbiturates: NOT DETECTED
COCAINE: POSITIVE — AB
Opiates: POSITIVE — AB
Tetrahydrocannabinol: POSITIVE — AB

## 2016-06-08 LAB — CBC
HCT: 44.2 % (ref 39.0–52.0)
Hemoglobin: 15.3 g/dL (ref 13.0–17.0)
MCH: 29.8 pg (ref 26.0–34.0)
MCHC: 34.6 g/dL (ref 30.0–36.0)
MCV: 86.2 fL (ref 78.0–100.0)
PLATELETS: 251 10*3/uL (ref 150–400)
RBC: 5.13 MIL/uL (ref 4.22–5.81)
RDW: 12.2 % (ref 11.5–15.5)
WBC: 7.8 10*3/uL (ref 4.0–10.5)

## 2016-06-08 LAB — ETHANOL

## 2016-06-08 MED ORDER — LOPERAMIDE HCL 2 MG PO CAPS: 2.0000 mg | ORAL_CAPSULE | ORAL | Status: DC | PRN

## 2016-06-08 MED ORDER — NAPROXEN 500 MG PO TABS: 500.0000 mg | ORAL_TABLET | Freq: Two times a day (BID) | ORAL | Status: DC | PRN

## 2016-06-08 MED ORDER — HYDROXYZINE HCL 25 MG PO TABS
25.0000 mg | ORAL_TABLET | Freq: Four times a day (QID) | ORAL | Status: DC | PRN
  Administered 2016-06-09: 25 mg via ORAL
  Filled 2016-06-08: qty 1

## 2016-06-08 MED ORDER — METHOCARBAMOL 500 MG PO TABS
500.0000 mg | ORAL_TABLET | Freq: Three times a day (TID) | ORAL | Status: DC | PRN
  Administered 2016-06-09: 500 mg via ORAL
  Filled 2016-06-08: qty 1

## 2016-06-08 MED ORDER — DICYCLOMINE HCL 20 MG PO TABS: 20.0000 mg | ORAL_TABLET | Freq: Four times a day (QID) | ORAL | Status: DC | PRN

## 2016-06-08 MED ORDER — ONDANSETRON 4 MG PO TBDP
4.0000 mg | ORAL_TABLET | Freq: Four times a day (QID) | ORAL | Status: DC | PRN
Start: 2016-06-08 — End: 2016-06-09

## 2016-06-08 NOTE — ED Notes (Signed)
Bed: WA26 Expected date:  Expected time:  Means of arrival:  Comments: 

## 2016-06-08 NOTE — Progress Notes (Signed)
This writer reviewed chart for placement.      Kennon Encinas, MSW, LCSW, LCAS, CCSI [facility] Triage Specialist [phone] 
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
## 2016-06-08 NOTE — H&P (Signed)
Behavioral Health Medical Screening Exam  Darren Parrish is an 35 y.o. male.  Total Time spent with patient: 15 minutes  Psychiatric Specialty Exam: Physical Exam  Vitals reviewed. Constitutional: He appears well-developed.  HENT:  Head: Normocephalic and atraumatic.  Eyes: Conjunctivae are normal. Pupils are equal, round, and reactive to light.  Neck: Normal range of motion.  Cardiovascular: Normal rate, regular rhythm and normal heart sounds.   Respiratory: Effort normal and breath sounds normal.  GI: Soft. Bowel sounds are normal.    Review of Systems  Constitutional: Positive for chills, fever, malaise/fatigue and weight loss.  Neurological: Positive for weakness.  Psychiatric/Behavioral: Positive for depression, substance abuse and suicidal ideas. The patient is nervous/anxious and has insomnia.   All other systems reviewed and are negative.   There were no vitals taken for this visit.There is no height or weight on file to calculate BMI.  General Appearance: Casual and Fairly Groomed  Eye Contact:  Good  Speech:  Clear and Coherent and Normal Rate  Volume:  Normal  Mood:  Anxious and Depressed  Affect:  Appropriate, Congruent and Depressed  Thought Process:  Coherent, Goal Directed, Linear and Descriptions of Associations: Intact  Orientation:  Full (Time, Place, and Person)  Thought Content:  Focused on opiate addiction  Suicidal Thoughts:  yes and unable to contract for safety  Homicidal Thoughts:  No  Memory:  Immediate;   Fair Recent;   Fair Remote;   Fair  Judgement:  Fair  Insight:  Fair  Psychomotor Activity:  Normal  Concentration: Concentration: Fair and Attention Span: Fair  Recall:  [name]  Fund of Knowledge:Fair  Language: Fair  Akathisia:  No  Handed:    AIMS (if indicated):     Assets:  Communication Skills Desire for Improvement Resilience Social Support  Sleep:       Musculoskeletal: Strength & Muscle Tone: within normal limits Gait &  Station: normal Patient leans: N/A  Blood pressure 136/86, pulse 95, temperature 99.6 F (37.6 C), resp. rate 18, SpO2 100 %.  Recommendations:  Based on my evaluation the patient does not appear to have an emergency medical condition.  Beau FannyWithrow, John C, FNP 06/08/2016, 6:29 PM   Agree with NP assessment as above

## 2016-06-08 NOTE — ED Triage Notes (Signed)
Patient here from behavioral health by Juel BurrowPelham reports he needs help with his heroin addiction.  Is here to be medically cleared for admission to behavioral health.  Patient also says he uses cocaine.  No suicidal ideation or homicidal ideation.

## 2016-06-08 NOTE — ED Notes (Signed)
Bed: WA27 Expected date:  Expected time:  Means of arrival:  Comments: 

## 2016-06-09 DIAGNOSIS — F332 Major depressive disorder, recurrent severe without psychotic features: Secondary | ICD-10-CM | POA: Diagnosis not present

## 2016-06-09 DIAGNOSIS — Z841 Family history of disorders of kidney and ureter: Secondary | ICD-10-CM

## 2016-06-09 DIAGNOSIS — F1920 Other psychoactive substance dependence, uncomplicated: Secondary | ICD-10-CM | POA: Diagnosis present

## 2016-06-09 DIAGNOSIS — F1994 Other psychoactive substance use, unspecified with psychoactive substance-induced mood disorder: Secondary | ICD-10-CM | POA: Diagnosis present

## 2016-06-09 DIAGNOSIS — Z801 Family history of malignant neoplasm of trachea, bronchus and lung: Secondary | ICD-10-CM | POA: Diagnosis not present

## 2016-06-09 DIAGNOSIS — Z79899 Other long term (current) drug therapy: Secondary | ICD-10-CM

## 2016-06-09 DIAGNOSIS — F17210 Nicotine dependence, cigarettes, uncomplicated: Secondary | ICD-10-CM

## 2016-06-09 DIAGNOSIS — Z888 Allergy status to other drugs, medicaments and biological substances status: Secondary | ICD-10-CM

## 2016-06-09 MED ORDER — CLONIDINE HCL 0.1 MG PO TABS: 0.1000 mg | ORAL_TABLET | ORAL | Status: DC

## 2016-06-09 MED ORDER — CLONIDINE HCL 0.1 MG PO TABS
0.1000 mg | ORAL_TABLET | Freq: Four times a day (QID) | ORAL | Status: DC
  Administered 2016-06-09: 0.1 mg via ORAL
  Filled 2016-06-09: qty 1

## 2016-06-09 MED ORDER — CLONIDINE HCL 0.1 MG PO TABS: 0.1000 mg | ORAL_TABLET | Freq: Every day | ORAL | Status: DC
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
## 2016-06-09 NOTE — ED Notes (Signed)
Patient denies SI/HI. Reports [facility].  In NAD.  All d/c paperwork provided.

## 2016-06-09 NOTE — ED Notes (Signed)
SBAR Report received from previous nurse. Pt received calm and visible on unit. Pt denies current SI/ HI, A/V H, depression, anxiety, or pain at this time, and appears otherwise stable and free of distress. pt contracts that he will talk to staff before he attempts self harm. Pt reminded of camera surveillance, q 15 min rounds, and rules of the milieu. Will continue to assess.

## 2016-06-09 NOTE — ED Notes (Signed)
Requesting d/c.  Denies SI. States family will be here at 1230 to pick up.  Informed provider.

## 2016-06-09 NOTE — ED Notes (Signed)
Pt asked writer what medications he had available for his detox, if he was going to get suboxone. Writer asked him his symptoms and informed pt that the psychiatrist may or may not prescribe that medication but that I had some other medications I could offer him. Pt was receptive to medication.

## 2016-06-09 NOTE — Consult Note (Signed)
[facility] Consult   Reason for Consult:  Polysubstance abuse Referring Physician:  EDP Patient Identification: Darren Parrish MRN:  [id number] Principal Diagnosis: Polysubstance dependence Diagnosis:   Patient Active Problem List   Diagnosis Date Noted  . Major depressive disorder, recurrent severe without psychotic features ([place]) [F33.2] 06/09/2016    Priority: High  . Polysubstance (including opioids) dependence with physiological dependence ([place]) [F19.20] 06/09/2016    Priority: High  . 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 Alcohol abuse [F10.10] 08/02/2014  . Pancreatitis, acute [K85.90] 08/01/2014  . Blunt trauma to abdomen [S39.81XA] 08/01/2014  . Fall [W19.XXXA] 07/31/2014    Total Time spent with patient: 45 minutes  Subjective:   Darren Parrish is a 35 y.o. male patient states, "This sucks, right now." Referring to withdrawal  HPI:  35 yo male patient who presents to the ED with suicidal ideations after abusing drugs.  He was at the [facility] this year but relapsed in October.  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  Today, he denies suicidal ideations and past attempts.  No homicidal ideations, hallucinations.  Sweating with myalgias at this time for withdrawal symptoms.  He was offered a bed but wants to leave with his brother to go to a Suboxone clinic as he does not want to go to detox.  Past Psychiatric History: substance abuse, depression  Risk to Self: No Risk to Others:  No Prior Inpatient Therapy:  Yes Prior Outpatient Therapy:  Yes  Past Medical History:  Past Medical History:  Diagnosis Date  . Chronic alcohol abuse   . Pancreatitis     Past Surgical History:  Procedure Laterality Date  .  thumb surgery Right   . WISDOM TOOTH EXTRACTION Bilateral    Family History:  Family History  Problem Relation Age of Onset  . Lung cancer Mother   . Kidney  failure Father    Family Psychiatric  History: none Social History:  History  Alcohol Use  . 3.6 oz/week  . 6 Cans of beer per week    Comment: chronic alcohol abuse     History  Drug Use  . Frequency: 7.0 times per week  . Types: Cocaine, Marijuana, Heroin    Comment: Daily use of heroin; episodic use THC, Coc    Social History   Social History  . Marital status: Married    Spouse name: N/A  . Number of children: N/A  . Years of education: N/A   Social History Main Topics  . Smoking status: Current Some Day Smoker    Packs/day: 1.00    Types: E-cigarettes  . Smokeless tobacco: Current User  . Alcohol use 3.6 oz/week    6 Cans of beer per week     Comment: chronic alcohol abuse  . Drug use:     Frequency: 7.0 times per week    Types: Cocaine, Marijuana, Heroin     Comment: Daily use of heroin; episodic use THC, Coc  . Sexual activity: Yes   Other Topics Concern  . None   Social History Narrative  . None   Additional Social History:    Allergies:   Allergies  Allergen Reactions  . Codeine Nausea Only    Labs:  Results for orders placed or performed during the hospital encounter of 06/08/16 (from the past 48 hour(s))  Comprehensive metabolic panel     Status: Abnormal   Collection Time: 06/08/16  7:13 PM  Result Value Ref Range   Sodium 141 135 - 145 mmol/L   Potassium 3.8 3.5 - 5.1 mmol/L   Chloride 106 101 - 111 mmol/L   CO2 26 22 - 32 mmol/L   Glucose, Bld 118 (H) 65 - 99 mg/dL   BUN 6 6 - 20 mg/dL   Creatinine, Ser 0.88 0.61 - 1.24 mg/dL   Calcium 9.2 8.9 - 10.3 mg/dL   Total Protein 7.4 6.5 - 8.1 g/dL   Albumin 4.6 3.5 - 5.0 g/dL   AST 27 15 - 41 U/L   ALT 17 17 - 63 U/L   Alkaline Phosphatase 85 38 - 126 U/L   Total Bilirubin 1.0 0.3 - 1.2 mg/dL   GFR calc non Af Amer >60 >60 mL/min   GFR calc Af Amer >60 >60 mL/min    Comment: (NOTE) The eGFR has been calculated using the CKD EPI equation. This calculation has not been validated in all  clinical situations. eGFR's persistently <60 mL/min signify possible Chronic Kidney Disease.    Anion gap 9 5 - 15  Ethanol     Status: None   Collection Time: 06/08/16  7:13 PM  Result Value Ref Range   Alcohol, Ethyl (B) <5 <5 mg/dL    Comment:        LOWEST DETECTABLE LIMIT FOR SERUM ALCOHOL IS 5 mg/dL FOR MEDICAL PURPOSES ONLY   cbc     Status: None   Collection Time: 06/08/16  7:13 PM  Result Value Ref Range   WBC 7.8 4.0 - 10.5 K/uL   RBC 5.13 4.22 - 5.81 MIL/uL   Hemoglobin 15.3 13.0 - 17.0 g/dL   HCT 44.2 39.0 - 52.0 %   MCV 86.2 78.0 - 100.0 fL   MCH 29.8 26.0 - 34.0 pg   MCHC 34.6 30.0 - 36.0 g/dL   RDW 12.2 11.5 - 15.5 %   Platelets 251 150 - 400 K/uL  Rapid urine drug screen (hospital performed)     Status: Abnormal   Collection Time: 06/08/16 10:09 PM  Result Value Ref Range   Opiates POSITIVE (A) NONE DETECTED   Cocaine POSITIVE (A) NONE DETECTED   Benzodiazepines NONE DETECTED NONE DETECTED   Amphetamines NONE DETECTED NONE DETECTED   Tetrahydrocannabinol POSITIVE (A) NONE DETECTED   Barbiturates NONE DETECTED NONE DETECTED    Comment:        DRUG SCREEN FOR MEDICAL PURPOSES ONLY.  IF CONFIRMATION IS NEEDED FOR ANY PURPOSE, NOTIFY LAB WITHIN 5 DAYS.        LOWEST DETECTABLE LIMITS FOR URINE DRUG SCREEN Drug Class       Cutoff (ng/mL) Amphetamine      1000 Barbiturate      200 Benzodiazepine   751 Tricyclics       700 Opiates          300 Cocaine          300 THC              50     Current Facility-Administered Medications  Medication Dose Route Frequency Provider Last Rate Last Dose  . cloNIDine (CATAPRES) tablet 0.1 mg  0.1 mg Oral QID Patrecia Pour, NP   0.1 mg at 06/09/16 0940   Followed by  . [START ON 06/11/2016] cloNIDine (CATAPRES) tablet 0.1 mg  0.1 mg Oral BH-qamhs Patrecia Pour, NP       Followed by  . [START ON 06/14/2016] cloNIDine (CATAPRES) tablet 0.1 mg  0.1 mg Oral QAC breakfast Patrecia Pour, NP      . dicyclomine  (BENTYL) tablet 20 mg  20 mg Oral Q6H PRN Duffy Bruce, MD      . hydrOXYzine (ATARAX/VISTARIL) tablet 25 mg  25 mg Oral Q6H PRN Duffy Bruce, MD   25 mg at 06/09/16 0428  . loperamide (IMODIUM) capsule 2-4 mg  2-4 mg Oral PRN Duffy Bruce, MD      . methocarbamol (ROBAXIN) tablet 500 mg  500 mg Oral Q8H PRN Duffy Bruce, MD   500 mg at 06/09/16 0428  . naproxen (NAPROSYN) tablet 500 mg  500 mg Oral BID PRN Duffy Bruce, MD      . ondansetron (ZOFRAN-ODT) disintegrating tablet 4 mg  4 mg Oral Q6H PRN Duffy Bruce, MD       Current Outpatient Prescriptions  Medication Sig Dispense Refill  . [facility] B-1 100 MG tablet Take 100 mg by mouth daily.  0  . esomeprazole (NEXIUM) 20 MG capsule Take 20 mg by mouth daily as needed (acid reflux).    Marland Kitchen ibuprofen (ADVIL,MOTRIN) 200 MG tablet Take 600 mg by mouth every 6 (six) hours as needed for headache or moderate pain.    . potassium chloride 20 MEQ TBCR Take 40 mEq by mouth daily. 6 tablet 0    Musculoskeletal: Strength & Muscle Tone: within normal limits Gait & Station: normal Patient leans: N/A  Psychiatric Specialty Exam: Physical Exam  Constitutional: He is oriented to person, place, and time. He appears well-developed.  HENT:  Head: Normocephalic.  Neck: Normal range of motion.  Respiratory: Effort normal.  Musculoskeletal: Normal range of motion.  Neurological: He is alert and oriented to person, place, and time.  Psychiatric: His speech is normal and behavior is normal. Judgment and thought content normal. His mood appears anxious. Cognition and memory are normal.    Review of Systems  Constitutional: Positive for diaphoresis and malaise/fatigue.  Musculoskeletal: Positive for myalgias.  Psychiatric/Behavioral: Positive for substance abuse. The patient is nervous/anxious.   All other systems reviewed and are negative.   Blood pressure 124/77, pulse 68, temperature 98.3 F (36.8 C), temperature source Oral, resp. rate 16,  SpO2 100 %.There is no height or weight on file to calculate BMI.  General Appearance: Disheveled  Eye Contact:  Good  Speech:  Normal Rate  Volume:  Normal  Mood:  Anxiety, mild  Affect:  Congruent  Thought Process:  Coherent and Descriptions of Associations: Intact  Orientation:  Full (Time, Place, and Person)  Thought Content:  WDL  Suicidal Thoughts:  No  Homicidal Thoughts:  No  Memory:  Immediate;   Good Recent;   Good Remote;   Good  Judgement:  Fair  Insight:  Fair  Psychomotor Activity:  Normal  Concentration:  Concentration: Good and Attention Span: Good  Recall:  Good  Fund of Knowledge:  Fair  Language:  Good  Akathisia:  No  Handed:  Right  AIMS (if indicated):     Assets:  Housing Leisure Time Physical Health Resilience  ADL's:  Intact  Cognition:  WNL  Sleep:        Treatment Plan Summary: Daily contact with patient to assess and evaluate symptoms and progress in treatment, Medication management and Plan polysubstance dependence with substance induced mood disorder:  -Crisis stabilization -Medication management:  Clonidine opiate withdrawal protocol started -Individual and substance abuse counseling  Disposition: No evidence of imminent risk to self or others at present.    LORD,  JAMISON, NP 06/09/2016 12:02 PM  Patient seen face-to-face for psychiatric evaluation, chart reviewed and case discussed with the physician extender and developed treatment plan. Reviewed the information documented and agree with the treatment plan. Corena Pilgrim, MD

## 2016-07-18 DEATH — deceased
